# Patient Record
Sex: Male | Born: 1975
Health system: Southern US, Community
[De-identification: ages and names within clinical notes are randomized; demographics above are authoritative.]

## PROBLEM LIST (undated history)

## (undated) ENCOUNTER — Emergency Department (HOSPITAL_COMMUNITY): Discharge status: Absent without leave | Payer: 59

## (undated) DIAGNOSIS — M1990 Unspecified osteoarthritis, unspecified site: Secondary | ICD-10-CM

## (undated) DIAGNOSIS — E785 Hyperlipidemia, unspecified: Secondary | ICD-10-CM

## (undated) DIAGNOSIS — I10 Essential (primary) hypertension: Secondary | ICD-10-CM

## (undated) DIAGNOSIS — J189 Pneumonia, unspecified organism: Secondary | ICD-10-CM

## (undated) DIAGNOSIS — Z9289 Personal history of other medical treatment: Secondary | ICD-10-CM

## (undated) HISTORY — DX: Unspecified osteoarthritis, unspecified site: M19.90

## (undated) HISTORY — DX: Hyperlipidemia, unspecified: E78.5

## (undated) HISTORY — PX: APPENDECTOMY: SHX54

## (undated) HISTORY — DX: Personal history of other medical treatment: Z92.89

---

## 2010-07-09 HISTORY — PX: COLONOSCOPY: SHX174

## 2012-08-11 DIAGNOSIS — J189 Pneumonia, unspecified organism: Secondary | ICD-10-CM

## 2012-08-11 HISTORY — DX: Pneumonia, unspecified organism: J18.9

## 2012-08-12 ENCOUNTER — Emergency Department (HOSPITAL_COMMUNITY): Payer: 59

## 2012-08-12 ENCOUNTER — Encounter (HOSPITAL_COMMUNITY): Payer: Self-pay | Admitting: *Deleted

## 2012-08-12 ENCOUNTER — Inpatient Hospital Stay (HOSPITAL_COMMUNITY)
Admission: EM | Admit: 2012-08-12 | Discharge: 2012-08-13 | DRG: 195 | Disposition: A | Discharge status: Home / Self Care | Payer: 59 | Attending: Internal Medicine | Admitting: Internal Medicine

## 2012-08-12 DIAGNOSIS — J189 Pneumonia, unspecified organism: Principal | ICD-10-CM | POA: Diagnosis present

## 2012-08-12 DIAGNOSIS — I10 Essential (primary) hypertension: Secondary | ICD-10-CM | POA: Diagnosis present

## 2012-08-12 DIAGNOSIS — R000 Tachycardia, unspecified: Secondary | ICD-10-CM | POA: Diagnosis present

## 2012-08-12 HISTORY — DX: Pneumonia, unspecified organism: J18.9

## 2012-08-12 HISTORY — DX: Essential (primary) hypertension: I10

## 2012-08-12 LAB — CBC WITH DIFFERENTIAL/PLATELET
Hemoglobin: 16.1 g/dL (ref 13.0–17.0)
Lymphs Abs: 3.2 10*3/uL (ref 0.7–4.0)
Monocytes Relative: 5 % (ref 3–12)
Neutro Abs: 6.5 10*3/uL (ref 1.7–7.7)
Neutrophils Relative %: 62 % (ref 43–77)
Platelets: 212 10*3/uL (ref 150–400)
RBC: 5.02 MIL/uL (ref 4.22–5.81)
WBC: 10.4 10*3/uL (ref 4.0–10.5)

## 2012-08-12 LAB — BASIC METABOLIC PANEL
BUN: 18 mg/dL (ref 6–23)
Chloride: 103 mEq/L (ref 96–112)
GFR calc Af Amer: 81 mL/min — ABNORMAL LOW (ref >90)
Glucose, Bld: 107 mg/dL — ABNORMAL HIGH (ref 70–99)
Potassium: 3.8 mEq/L (ref 3.5–5.1)
Sodium: 140 mEq/L (ref 135–145)

## 2012-08-12 LAB — EXPECTORATED SPUTUM ASSESSMENT W GRAM STAIN, RFLX TO RESP C: Special Requests: NORMAL

## 2012-08-12 LAB — PRO B NATRIURETIC PEPTIDE: Pro B Natriuretic peptide (BNP): 37.3 pg/mL (ref 0–125)

## 2012-08-12 MED ORDER — DEXTROSE 5 % IV SOLN
500.0000 mg | INTRAVENOUS | Status: DC
  Administered 2012-08-12: 500 mg via INTRAVENOUS
  Filled 2012-08-12 (×2): qty 500

## 2012-08-12 MED ORDER — DEXTROSE 5 % IV SOLN
1.0000 g | INTRAVENOUS | Status: DC
  Administered 2012-08-12: 1 g via INTRAVENOUS
  Filled 2012-08-12 (×2): qty 10

## 2012-08-12 MED ORDER — IBUPROFEN 800 MG PO TABS
800.0000 mg | ORAL_TABLET | Freq: Once | ORAL | Status: AC
  Administered 2012-08-12: 800 mg via ORAL
  Filled 2012-08-12: qty 1

## 2012-08-12 MED ORDER — SODIUM CHLORIDE 0.9 % IV SOLN
INTRAVENOUS | Status: DC
  Administered 2012-08-12: 23:00:00 via INTRAVENOUS
  Administered 2012-08-12: 100 mL/h via INTRAVENOUS

## 2012-08-12 NOTE — ED Notes (Signed)
Paged teaching service per Dr. Radford Pax

## 2012-08-12 NOTE — ED Notes (Signed)
Pt here complaining of respiratory distress, cough, tachycardia, and HTN.

## 2012-08-12 NOTE — ED Notes (Signed)
Transferred to xray on stretcher.

## 2012-08-12 NOTE — H&P (Signed)
[facility] History and Physical  Cody Ternes WUJ:[id number] DOB: 04/11/1976 DOA: 08/12/2012  Referring physician: Dr. Radford Pax PCP: No primary provider on file.   Chief Complaint: SOB, fever/chills  HPI: Cody Graham is a 37 y.o. 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 He had an URI/sinus symptoms last week and went to see his PCP. He was placed on Augmentin at that time and he took that since last Friday (~5 days). He felt progressively worse over the weekend and feeling weak. Today he had fever/rigors/chills and he took his own pulse and was in the 120s. He denies any chest pain, has no NV. Endorses diarrhea since starting taking his Augmentin with loose stools. He has a sore throat which has been there since last week. Has a rapid strep Ag test at the Urgent center which was negative last week. Denies leg swelling.  Review of Systems: as per HPI otherwise negative.   Past Medical History  Diagnosis Date  . Hypertension    Past Surgical History  Procedure Date  . Appendectomy    Social History: No smoking, rare alcohol (social), no drugs.   Allergies  Allergen Reactions  . Lisinopril     cough   Family history Father - colon cancer  Prior to Admission medications   Medication Sig Start Date End Date Taking? Authorizing Provider  Amoxicillin-Pot Clavulanate (AUGMENTIN PO) Take 1 tablet by mouth 2 (two) times daily. For 7 days starting on 08/08/12   Yes Historical Provider, MD  ibuprofen (ADVIL,MOTRIN) 200 MG tablet Take 800 mg by mouth every 8 (eight) hours as needed. For pain   Yes Historical Provider, MD   Physical Exam: Filed Vitals:   08/12/12 0918 08/12/12 1004 08/12/12 1030  BP: 180/134 159/83 136/68  Pulse: 123  109  Temp: 100.4 F (38 C)    TempSrc: Oral    Resp: 28    SpO2: 99%  99%     General:  NAD, flushed  Eyes: right conjunctivae injected, PERRL, EOMI  ENT: mild  oropharynx erythema  Neck: supple, no JVD, no LAD  Cardiovascular: RRR, tachycardic, no MRG  Respiratory: good air movement, no wheezing, no crackles  Abdomen: soft, NTTP  Skin: no rashes  Musculoskeletal: no LE edema  Psychiatric: normal mood and affect  Neurologic: non focal, moves all 4   Labs on Admission:  Basic Metabolic Panel:  Lab 08/12/12 9562  NA 140  K 3.8  CL 103  CO2 26  GLUCOSE 107*  BUN 18  CREATININE 1.28  CALCIUM 9.2  MG --  PHOS --   CBC:  Lab 08/12/12 0920  WBC 10.4  NEUTROABS 6.5  HGB 16.1  HCT 43.6  MCV 86.9  PLT 212   Radiological Exams on Admission: Dg Chest 2 View  08/12/2012  *RADIOLOGY REPORT*  Clinical Data: Fever, tachycardia  CHEST - 2 VIEW  Comparison: None  Findings: Normal mediastinum and cardiac silhouette.  There is small focus of air space disease in the right upper lobe concerning for early pneumonia.  No pleural fluid.  No pneumothorax.  IMPRESSION: Early right upper lobe pneumonia.   Original Report Authenticated By: Genevive Bi, M.D.     EKG: Independently reviewed.   Assessment/Plan Active Problems:  CAP (community acquired pneumonia)  Hypertension   1. CAP - failed outpatient therapy with Augmentin. Will admit for observation, Ceftriaxone/Azithromycin. Pending clinical improvement can be transitioned to po  Levaquin tomorrow. IV fluids as he looks dehydrated and tachycardic. Already received fluid bolus in the ED. 2. HTN - had HTN in the past, did not tolerate Lisinopril. Currently on no medication. Resolved after initially high blood pressure in the ED. Will continue to monitor. 3. Ppx - early ambulation, SCDs.  Code Status: Full Family Communication: none Disposition Plan: 1-2 days  Time spent: 50 min for chart review, patient exam and plan formulation.   Pamella Pert [facility] Pager [phone]  If 7PM-7AM, please contact night-coverage [email or web address] Password [email or web address] 08/12/2012, 11:45  AM

## 2012-08-12 NOTE — ED Notes (Signed)
Admitting MD at bedside. Pt alert and oriented. Talking on phone with family. Receiving second bag of fluid, HR remains 110-120. No needs at this time.

## 2012-08-12 NOTE — ED Notes (Signed)
Phlebotomy at bedside drawing labs. Will admin antibiotics thereafter. Waiting for ready bed placement.

## 2012-08-12 NOTE — ED Notes (Signed)
C/o sob cough just generalized bodyaches with fever chills.

## 2012-08-13 DIAGNOSIS — I10 Essential (primary) hypertension: Secondary | ICD-10-CM

## 2012-08-13 DIAGNOSIS — J189 Pneumonia, unspecified organism: Secondary | ICD-10-CM | POA: Diagnosis present

## 2012-08-13 LAB — LEGIONELLA ANTIGEN, URINE: Legionella Antigen, Urine: NEGATIVE

## 2012-08-13 MED ORDER — LEVOFLOXACIN 750 MG PO TABS: 750.0000 mg | ORAL_TABLET | Freq: Every day | ORAL | Status: DC

## 2012-08-13 MED ORDER — ACETAMINOPHEN 325 MG PO TABS
650.0000 mg | ORAL_TABLET | ORAL | Status: DC | PRN
  Administered 2012-08-13: 650 mg via ORAL
  Filled 2012-08-13: qty 2

## 2012-08-13 MED ORDER — LEVOFLOXACIN 750 MG PO TABS
750.0000 mg | ORAL_TABLET | Freq: Every day | ORAL | Status: DC
  Administered 2012-08-13: 750 mg via ORAL
  Filled 2012-08-13: qty 1

## 2012-08-13 MED ORDER — HYDROCOD POLST-CHLORPHEN POLST 10-8 MG/5ML PO LQCR: 5.0000 mL | Freq: Two times a day (BID) | ORAL | Status: DC | PRN

## 2012-08-13 NOTE — Progress Notes (Signed)
Cody Graham discharged Home per MD order.  Discharge instructions reviewed and discussed with the patient, all questions and concerns answered. Copy of instructions and scripts given to patient.  Explained how to sign up for Mychart.   [name], Cody  Home Medication Instructions [id number]   Printed on:08/13/12 1219  Medication Information                    ibuprofen (ADVIL,MOTRIN) 200 MG tablet Take 800 mg by mouth every 8 (eight) hours as needed. For pain           levofloxacin (LEVAQUIN) 750 MG tablet Take 1 tablet (750 mg total) by mouth daily.           chlorpheniramine-HYDROcodone (TUSSIONEX PENNKINETIC ER) 10-8 MG/5ML LQCR Take 5 mLs by mouth every 12 (twelve) hours as needed.             Patients skin is clean, dry and intact, no evidence of skin break down. IV site discontinued and catheter remains intact. Site without signs and symptoms of complications. Dressing and pressure applied.  Patient escorted to car by NS/MT ambulating,  no distress noted upon discharge.  Cody Graham, Cody Graham 08/13/2012 12:19 PM

## 2012-08-13 NOTE — Discharge Summary (Signed)
-   Agree with as above continue oral antibiotics. Followup with her primary care Dr. next week.

## 2012-08-13 NOTE — Care Management Note (Signed)
    Page 1 of 1   08/13/2012     10:34:46 AM   CARE MANAGEMENT NOTE 08/13/2012  Patient:  BRANDIS, MATSUURA   Account Number:  [id number]  Date Initiated:  08/13/2012  Documentation initiated by:  Letha Cape  Subjective/Objective Assessment:   dx pna  admit- pta independent.     Action/Plan:   Anticipated DC Date:  08/13/2012   Anticipated DC Plan:  HOME/SELF CARE      DC Planning Services  CM consult      Choice offered to / List presented to:             Status of service:  Completed, signed off Medicare Important Message given?   (If response is "NO", the following Medicare IM given date fields will be blank) Date Medicare IM given:   Date Additional Medicare IM given:    Discharge Disposition:  HOME/SELF CARE  Per UR Regulation:  Reviewed for med. necessity/level of care/duration of stay  If discussed at Long Length of Stay Meetings, dates discussed:    Comments:  08/13/12 10:33 Letha Cape RN, BSN [phone] patient lives with spouse, pta independent, patient has medication coverage and transportation at discharge, no needs anticipated.

## 2012-08-13 NOTE — Discharge Summary (Signed)
Physician Discharge Summary  Cody Nunn ZOX:[id number] DOB: [birth year] DOA: 08/12/2012  PCP: No primary provider on file.  Admit date: 08/12/2012 Discharge date: 08/13/2012  Time spent: 30 minutes  Recommendations for Outpatient Follow-up:  Monitor to ensure resolution of symptoms. Patient had some tachycardia, high blood pressure, and diarrhea while he was inpatient.  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 He had an URI/sinus symptoms as well as pinkeye last week and went to see his PCP. He was placed on Augmentin at that time and he took that ~5 days. He felt progressively worse over the weekend and feeling weak. Today he had fever/rigors/chills and he took his own pulse and was in the 120s. He denies any chest pain, has no NV. Endorses diarrhea since starting taking his Augmentin with loose stools. He has a sore throat which has been there since last week. Has a rapid strep Ag test at the Urgent center which was negative last week. Denies leg swelling.   Hospital Course:  Chest x-ray revealed right upper lobe opacity which was felt to be early pneumonia. Because of the exposure this particular patient has working in the emergency department, this would be appropriately categorized as healthcare associated pneumonia and should be treated as such. Mr. Kats was given IV Rocephin and a azithromycin for coverage of healthcare associated pneumonia. Blood cultures were obtained but have not yet  resulted. Urine Legionella antigen and urine strep pneumo were both negative. Initially the patient had an elevated pulse rate (123) and elevated blood pressure (180/134), however, overnight with antibiotics and IV fluids his pulse and blood pressure returned to normal levels. When I examined Mr. Dauber he appeared nontoxic and in no distress. We discussed his diarrhea. He had had 2 episodes on February 4, and 2 episodes this morning. He reported that his stools were starting to firm up and did not smell as though they were infectious diarrhea. We concluded that the diarrhea was most likely secondary to Augmentin. He still had a griping non-productive cough for which we prescribed Tussionex (particularly for use at bedtime).  After he ambulated in the hallway without shortness of breath or weakness, he was discharged to home with 6 more days of oral antibiotic therapy (Levaquin).   Discharge Exam: Filed Vitals:   08/12/12 1159 08/12/12 1351 08/12/12 2107 08/13/12 0614  BP: 126/79 137/84 131/75 127/79  Pulse: 109 97 100 68  Temp: 99.1 F (37.3 C) 98.1 F (36.7 C) 98.2 F (36.8 C) 97.9 F (36.6 C)  TempSrc: Oral Oral Oral Oral  Resp: 22 18 18 20   Height:  6' 4.8" (1.951 m)    Weight:  111.585 kg (246 lb)    SpO2: 98% 96% 100% 98%    General: Well-developed well-nourished Caucasian male lying comfortably in bed HEENT: Pupils equal and round, no obvious exudates or erythema in oropharynx, moist mucous membranes Cardiovascular: Regular rate and rhythm no murmurs rubs or gallops Respiratory: Clear to auscultation bilaterally, no accessory muscle use, nonproductive paroxysmal  cough Abdomen: Nontender, nondistended, positive bowel sounds, no obvious masses Skin: No obvious rashes, bruises, lesions Extremities: No edema, able to move all 4, symmetric strength   Discharge Instructions      Discharge Orders    Future Orders Please Complete By Expires   Diet - low sodium heart healthy       Increase activity slowly          Medication List     As of 08/13/2012  4:00 PM    STOP taking these medications         AUGMENTIN PO      TAKE these medications         chlorpheniramine-HYDROcodone 10-8 MG/5ML Lqcr   Commonly known as: TUSSIONEX   Take 5 mLs by mouth every 12 (twelve) hours as needed.      ibuprofen 200 MG tablet   Commonly known as: ADVIL,MOTRIN   Take 800 mg by mouth every 8 (eight) hours as needed. For pain      levofloxacin 750 MG tablet   Commonly known as: LEVAQUIN   Take 1 tablet (750 mg total) by mouth daily.            The results of significant diagnostics from this hospitalization (including imaging, microbiology, ancillary and laboratory) are listed below for reference.    Significant Diagnostic Studies: Dg Chest 2 View  08/12/2012  *RADIOLOGY REPORT*  Clinical Data: Fever, tachycardia  CHEST - 2 VIEW  Comparison: None  Findings: Normal mediastinum and cardiac silhouette.  There is small focus of air space disease in the right upper lobe concerning for early pneumonia.  No pleural fluid.  No pneumothorax.  IMPRESSION: Early right upper lobe pneumonia.   Original Report Authenticated By: Genevive Bi, M.D.     Microbiology: Recent Results (from the past 240 hour(s))  CULTURE, BLOOD (ROUTINE X 2)     Status: Normal (Preliminary result)   Collection Time   08/12/12 12:05 PM      Component Value Range Status Comment   Specimen Description BLOOD LEFT ARM   Final    Special Requests     Final    Value: BOTTLES DRAWN AEROBIC AND ANAEROBIC AERO 10CC ANA 5CC   Culture  Setup Time 08/12/2012 15:50   Final    Culture     Final    Value:        BLOOD CULTURE RECEIVED NO GROWTH TO DATE CULTURE WILL BE HELD FOR 5 DAYS BEFORE ISSUING A FINAL NEGATIVE REPORT   Report Status PENDING   Incomplete   CULTURE, BLOOD (ROUTINE X 2)     Status: Normal (Preliminary result)   Collection Time   08/12/12 12:15 PM      Component Value Range Status Comment    Specimen Description BLOOD HAND LEFT   Final    Special Requests     Final    Value: BOTTLES DRAWN AEROBIC AND ANAEROBIC BLUE 10CC RED 5CC NO 2   Culture  Setup Time 08/12/2012 15:50   Final    Culture     Final    Value:        BLOOD CULTURE RECEIVED NO GROWTH TO DATE CULTURE WILL BE HELD FOR 5 DAYS BEFORE ISSUING A FINAL NEGATIVE REPORT   Report Status PENDING   Incomplete   CULTURE, EXPECTORATED SPUTUM-ASSESSMENT     Status: Normal   Collection Time   08/12/12  9:00 PM      Component Value Range Status Comment  Specimen Description SPUTUM   Final    Special Requests Normal   Final    Sputum evaluation     Final    Value: THIS SPECIMEN IS ACCEPTABLE. RESPIRATORY CULTURE REPORT TO FOLLOW.   Report Status 08/12/2012 FINAL   Final      Labs: Basic Metabolic Panel:  Lab 08/12/12 0347  NA 140  K 3.8  CL 103  CO2 26  GLUCOSE 107*  BUN 18  CREATININE 1.28  CALCIUM 9.2  MG --  PHOS --   CBC:  Lab 08/12/12 0920  WBC 10.4  NEUTROABS 6.5  HGB 16.1  HCT 43.6  MCV 86.9  PLT 212   BNP: BNP (last 3 results)  Basename 08/12/12 1211  PROBNP 37.3      Signed:  Algis Downs, PA-C  [facility]  [phone]  08/13/2012, 4:00 pm.

## 2012-08-14 NOTE — ED Provider Notes (Signed)
History     CSN: [id number]  Arrival date & time 08/12/12  0915   First MD Initiated Contact with Patient 08/12/12 [phone]      Chief Complaint  Patient presents with  . Shortness of Breath     HPI C/o sob cough just generalized bodyaches with fever chills.   Has been on Augmentin for 4-5 days.  Some SOB with exertion.  Past Medical History  Diagnosis Date  . Hypertension   . Pneumonia 08/11/2012    Past Surgical History  Procedure Date  . Appendectomy   . Colonoscopy 2012    History reviewed. No pertinent family history.  History  Substance Use Topics  . Smoking status: Never Smoker   . Smokeless tobacco: Never Used  . Alcohol Use: Yes      Review of Systems  All other systems reviewed and are negative.    Allergies  Lisinopril  Home Medications   Current Outpatient Rx  Name  Route  Sig  Dispense  Refill  . IBUPROFEN 200 MG PO TABS   Oral   Take 800 mg by mouth every 8 (eight) hours as needed. For pain         . HYDROCOD POLST-CPM POLST ER 10-8 MG/5ML PO LQCR   Oral   Take 5 mLs by mouth every 12 (twelve) hours as needed.   140 mL   0   . LEVOFLOXACIN 750 MG PO TABS   Oral   Take 1 tablet (750 mg total) by mouth daily.   6 tablet   0     BP 127/79  Pulse 68  Temp 97.9 F (36.6 C) (Oral)  Resp 20  Ht 6' 4.8" (1.951 m)  Wt 246 lb (111.585 kg)  BMI 29.32 kg/m2  SpO2 98%  Physical Exam  Nursing note and vitals reviewed. Constitutional: He is oriented to person, place, and time. He appears well-developed and well-nourished. No distress.  HENT:  Head: Normocephalic and atraumatic.  Eyes: Pupils are equal, round, and reactive to light.  Neck: Normal range of motion.  Cardiovascular: Normal rate and intact distal pulses.   Pulmonary/Chest: No respiratory distress. He has rales (R lung).  Abdominal: Normal appearance. He exhibits no distension.  Musculoskeletal: Normal range of motion.  Neurological: He is alert and oriented to person,  place, and time. No cranial nerve deficit.  Skin: Skin is warm and dry. No rash noted.  Psychiatric: He has a normal mood and affect. His behavior is normal.    ED Course  Procedures (including critical care time)  Labs Reviewed  CBC WITH DIFFERENTIAL - Abnormal; Notable for the following:    MCHC 36.9 (*)     All other components within normal limits  BASIC METABOLIC PANEL - Abnormal; Notable for the following:    Glucose, Bld 107 (*)     GFR calc non Af Amer 70 (*)     GFR calc Af Amer 81 (*)     All other components within normal limits  PRO B NATRIURETIC PEPTIDE  HIV ANTIBODY (ROUTINE TESTING)  CULTURE, BLOOD (ROUTINE X 2)  CULTURE, BLOOD (ROUTINE X 2)  CULTURE, EXPECTORATED SPUTUM-ASSESSMENT  LEGIONELLA ANTIGEN, URINE  STREP PNEUMONIAE URINARY ANTIGEN  CULTURE, RESPIRATORY (NON-EXPECTORATED)  GRAM STAIN   No results found.   1. Community acquired pneumonia   2. Hypertension   3. CAP (community acquired pneumonia)       MDM        Nelia Shi, MD 08/14/12 1336

## 2012-08-15 LAB — CULTURE, RESPIRATORY W GRAM STAIN

## 2012-08-18 LAB — CULTURE, BLOOD (ROUTINE X 2): Culture: NO GROWTH

## 2013-07-09 DIAGNOSIS — I10 Essential (primary) hypertension: Secondary | ICD-10-CM

## 2013-07-09 HISTORY — DX: Essential (primary) hypertension: I10

## 2014-09-02 ENCOUNTER — Other Ambulatory Visit (HOSPITAL_COMMUNITY): Payer: Self-pay | Admitting: Orthopaedic Surgery

## 2014-09-02 DIAGNOSIS — M25511 Pain in right shoulder: Secondary | ICD-10-CM

## 2014-09-29 ENCOUNTER — Ambulatory Visit (HOSPITAL_COMMUNITY)
Admission: RE | Admit: 2014-09-29 | Discharge: 2014-09-29 | Disposition: A | Discharge status: Home / Self Care | Payer: 59 | Source: Ambulatory Visit | Attending: Orthopaedic Surgery | Admitting: Orthopaedic Surgery

## 2014-09-29 DIAGNOSIS — M19011 Primary osteoarthritis, right shoulder: Secondary | ICD-10-CM | POA: Diagnosis not present

## 2014-09-29 DIAGNOSIS — M25511 Pain in right shoulder: Secondary | ICD-10-CM | POA: Insufficient documentation

## 2014-09-29 DIAGNOSIS — R937 Abnormal findings on diagnostic imaging of other parts of musculoskeletal system: Secondary | ICD-10-CM | POA: Insufficient documentation

## 2014-09-29 DIAGNOSIS — R531 Weakness: Secondary | ICD-10-CM | POA: Diagnosis not present

## 2014-11-29 ENCOUNTER — Encounter: Payer: Self-pay | Admitting: Rehabilitative and Restorative Service Providers"

## 2014-11-29 ENCOUNTER — Ambulatory Visit (INDEPENDENT_AMBULATORY_CARE_PROVIDER_SITE_OTHER): Payer: 59 | Admitting: Rehabilitative and Restorative Service Providers"

## 2014-11-29 DIAGNOSIS — G8929 Other chronic pain: Secondary | ICD-10-CM | POA: Diagnosis not present

## 2014-11-29 DIAGNOSIS — M25611 Stiffness of right shoulder, not elsewhere classified: Secondary | ICD-10-CM

## 2014-11-29 DIAGNOSIS — M25511 Pain in right shoulder: Secondary | ICD-10-CM

## 2014-11-29 NOTE — Patient Instructions (Addendum)
Worked on posture and alignment with use of foam roll Education re posture and shoulder pathology  Scapular Retraction (Standing)   With arms at sides, pinch shoulder blades together. Repeat 10____ times per set. Do _several___ sets per day.   http://orth.exer.us/944   Copyright  VHI. All rights reserved.  Biceps, Standing holding stationary object   Place arms behind back with hands together, palms facing down. Have partner raise hands. Hold 20 seconds. Repeat __3_ times per session. Do 2-3___ sessions per day.  Copyright  VHI. All rights reserved.  CHEST: Doorway, Bilateral - Standing   Standing in doorway, place hands on wall with hands even with shoulders; elbows bent at shoulder height, hands up high. Shift hips forward through the doorway as tolerated - No weight on arms - weight should be through legs Hold _20_ seconds. Work toward 30 sec hold_2-3__ reps each position, 2-4 times/day

## 2014-11-29 NOTE — Therapy (Signed)
[place], [place], [address] Phone: [phone]   Fax:  [phone]  Physical Therapy Evaluation  Patient Details  Name: Cody Graham MRN: [id number] Date of Birth: 1976/01/11 Referring Provider:  Leandrew Koyanagi, MD  Encounter Date: 11/29/2014      PT End of Session - 11/29/14 1021    Visit Number 1   Number of Visits 8   Date for PT Re-Evaluation 01/17/15   PT Start Time 1016   PT Stop Time 1115   PT Time Calculation (min) 59 min   Activity Tolerance Patient tolerated treatment well;No increased pain      Past Medical History  Diagnosis Date  . Hypertension   . Pneumonia 08/11/2012    Past Surgical History  Procedure Laterality Date  . Appendectomy    . Colonoscopy  2012    There were no vitals filed for this visit.  Visit Diagnosis:  Right anterior shoulder pain  Stiffness of joint, shoulder region, right  Chronic shoulder pain, right      Subjective Assessment - 11/29/14 1027    Subjective Shoulder pain for past 7 months - no known cause of injury. Received injection with [place] March/April with some improvement - unable to maintain sustanined postures with Rt UE; throw item; reaching behind body   Pertinent History PMH: injury to R shoulder at 39 years old arm pulled behind him rehabed for about 1 year and then returned to normal functional activities   Limitations Other (comment)  throwing, reaching, bench press, quick movements   Diagnostic tests X-rays and MRI - negative   Patient Stated Goals To able to use arm normally without pain   Currently in Pain? Yes   Pain Score 8    Pain Location Shoulder   Pain Orientation Right   Pain Descriptors / Indicators Sharp;Stabbing;Shooting   Pain Type Chronic pain   Pain Onset More than a month ago   Pain Frequency Intermittent   Aggravating Factors  reaching behind back, wipping surfaces, throwing, lying on R side arm extended    Pain Relieving Factors OTC antiinflammatory, rest   Effect of Pain on Daily Activities limits activities that produce pain   Multiple Pain Sites No            [facility] PT Assessment - 11/29/14 0001    Assessment   Medical Diagnosis --  Shoulder pain; biceps tendinitis   Onset Date/Surgical Date --  04/22/14   Next MD Visit --  none scheduled   Prior Function   Level of Independence Independent with basic ADLs   Vocation Full time employment   Vocation Requirements --  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   Leisure --  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    Observation/Other Assessments   Observations empty can + Rt; - hawkins, - impingement   ROM / Strength   AROM / PROM / Strength AROM;PROM;Strength   AROM   AROM Assessment Site Shoulder;Cervical   Right/Left Shoulder Right;Left   Right Shoulder Extension 35 Degrees   Right Shoulder Flexion 142 Degrees   Right Shoulder ABduction 148 Degrees   Right Shoulder Internal Rotation 0 Degrees  functional T12   Right Shoulder External Rotation 90 Degrees  58 in neutral   Left Shoulder Extension 50 Degrees   Left Shoulder Flexion 164 Degrees   Left Shoulder ABduction 155 Degrees   Left Shoulder Internal Rotation 30  Degrees  function T7 with thumb   Left Shoulder External Rotation 90 Degrees  in neutral 57   Cervical - Right Side Bend 25   Cervical - Left Side Bend 32   Cervical - Right Rotation 69   Cervical - Left Rotation 55   Strength   Overall Strength Comments Bil elbow 5/5   Strength Assessment Site Shoulder   Right/Left Shoulder Right;Left   Right Shoulder Flexion 5/5   Right Shoulder Extension 5/5   Right Shoulder ABduction 5/5   Right Shoulder Internal Rotation 5/5   Right Shoulder External Rotation 5/5  pain   Left Shoulder Flexion 5/5   Left Shoulder Extension 5/5   Left Shoulder ABduction 5/5   Left Shoulder Internal Rotation 5/5   Left Shoulder External Rotation 5/5    Flexibility   Soft Tissue Assessment /Muscle Length --  shortened pecs bilat   Palpation   Palpation comment --  tightness noted pecs, trap, teres, biceps                   [facility] Adult PT Treatment/Exercise - 11/29/14 0001    Posture/Postural Control   Posture/Postural Control Postural limitations   Posture Comments Head forward shoulders rounded and elevated   Exercises   Exercises Shoulder   Shoulder Exercises: Standing   Other Standing Exercises scapular retraction down and back 10 reps 5-10 sec hold   Other Standing Exercises snow angel passive supine 5-10 minutes   Shoulder Exercises: Stretch   Corner Stretch 3 reps;20 seconds   Corner Stretch Limitations --  3 position doorwar stretch    Other Shoulder Stretches --  biceps stretch 3 reps 20-30 sec hold 3-4 times/day   Cryotherapy   Number Minutes Cryotherapy 12 Minutes   Cryotherapy Location Shoulder   Type of Cryotherapy Ice pack                PT Education - 11/29/14 1328    Education provided Yes   Education Details Postural education and correction; exercise instruction; HEP   Person(s) Educated Patient   Methods Explanation;Demonstration;Tactile cues;Verbal cues;Handout   Comprehension Verbalized understanding;Returned demonstration;Verbal cues required;Tactile cues required          PT Short Term Goals - 11/29/14 1350    PT SHORT TERM GOAL #3   Title Decrease pain in Rt shoulder to 3-4/10    Time 4   Period Weeks   Status New           PT Long Term Goals - 11/29/14 1342    PT LONG TERM GOAL #1   Title Increase Rt shoulder AROM by 5-10 degrees in all limited planes   Time 6   Period Weeks   Status New   PT LONG TERM GOAL #2   Title Improve functional activity level allowing patient to reach behind back without pain    Time 8   Period Weeks   Status New   PT LONG TERM GOAL #3   Title Decrease FOTO to 25 or less   Time 8   Period Weeks   Status New                Plan - 11/29/14 1330    Clinical Impression Statement Patient presents with chronic, recurrent Rt shoulder pain following a reaching injury 10/16 when he reached behind his body. He has had continued intermittent pain since injury. Injection helped but symptoms continue. Pt has pain with functional activities, limited ROM and flexibility, muscular  imbalance, abnormal posture and alignment - (UE's in IR at sides, scapulae abducted and rotated along the thoracic wall, head of the humerus anterior in orientation, forward head posture, increased thoracic kyphosis)   Pt will benefit from skilled therapeutic intervention in order to improve on the following deficits Decreased activity tolerance;Decreased range of motion;Hypomobility;Decreased mobility;Impaired UE functional use   Rehab Potential Excellent   PT Frequency 1x / week   PT Duration 8 weeks   PT Treatment/Interventions ADLs/Self Care Home Management;Cryotherapy;Electrical Stimulation;Iontophoresis 4mg /ml Dexamethasone;Moist Heat;Ultrasound;Therapeutic activities;Therapeutic exercise;Neuromuscular re-education;Patient/family education;Manual techniques;Passive range of motion   PT Next Visit Plan Postural work; scapular stabilization; shoulder stretching/ROM - progressing to posterior shoulder girdle strengthening    PT Home Exercise Plan HEP and postural work   [employer] and Agree with Plan of Care Patient         Problem List Patient Active Problem List   Diagnosis Date Noted  . Healthcare-associated pneumonia 08/13/2012  . Hypertension 08/12/2012    Everardo All, PT, MPH 11/29/2014, 2:30 PM  [facility] [place], [place], [address] Phone: [phone]   Fax:  [phone]

## 2014-12-08 ENCOUNTER — Ambulatory Visit (INDEPENDENT_AMBULATORY_CARE_PROVIDER_SITE_OTHER): Payer: 59 | Admitting: Rehabilitative and Restorative Service Providers"

## 2014-12-08 DIAGNOSIS — M25511 Pain in right shoulder: Secondary | ICD-10-CM

## 2014-12-08 DIAGNOSIS — G8929 Other chronic pain: Secondary | ICD-10-CM

## 2014-12-08 DIAGNOSIS — M25611 Stiffness of right shoulder, not elsewhere classified: Secondary | ICD-10-CM

## 2014-12-08 NOTE — Patient Instructions (Addendum)
(  Clinic) Extension / Flexion (Assist)   Face pulley, right arm as far forward and up as is pain free. Pull arm down toward side. Repeat __10__ times per set. Do _2-3___ sets per session. Keep shoulder blades down and back throughout   Copyright  VHI. All rights reserved.  High Row: Standing   Face anchor, feet shoulder width apart. Thumbs up arms by side, pull arms back, squeezing shoulder blades together. Repeat 10__ times per set. Do _2-3_ sets per session. Do _1_ sessions per day Anchor Height: Chest Keep shoulder blades down and back throughout.  http://tub.exer.us/63   Copyright  VHI. All rights reserved.   Scapula Adduction With Pectorals, Low   Stand in doorframe with palms against frame and arms at 45. Lean forward and squeeze shoulder blades. Hold _30__ seconds. Repeat __3_ times per session. Do __3-4_ sessions per day.  Copyright  VHI. All rights reserved.    Scapula Adduction With Pectorals, Mid-Range   Stand in doorframe with palms against frame and arms at 90. Lean forward and squeeze shoulder blades. Hold _30__ seconds. Repeat 3___ times per session. Do _3-4__ sessions per day. \Scapula Adduction With Pectorals, High   Stand in doorframe with palms against frame and arms at 120. Lean forward and squeeze shoulder blades. Hold 30___ seconds. Repeat _3__ times per session. Do 3-4___ sessions per day.  Copyright  VHI. All rights reserved.

## 2014-12-08 NOTE — Therapy (Addendum)
[place], [place], [address] Phone: [phone]   Fax:  [phone]  Physical Therapy Treatment  Patient Details  Name: Cody Graham MRN: [id number] Date of Birth: 1975/10/02 Referring Provider:  Leandrew Koyanagi, MD  Encounter Date: 12/08/2014      PT End of Session - 12/08/14 0939    Visit Number 2   Number of Visits 8   Date for PT Re-Evaluation 01/17/15   PT Start Time 0940   PT Stop Time 3818   PT Time Calculation (min) 65 min   Activity Tolerance Patient tolerated treatment well;No increased pain      Past Medical History  Diagnosis Date  . Hypertension   . Pneumonia 08/11/2012    Past Surgical History  Procedure Laterality Date  . Appendectomy    . Colonoscopy  2012    There were no vitals filed for this visit.  Visit Diagnosis:  Right anterior shoulder pain  Stiffness of joint, shoulder region, right  Chronic shoulder pain, right      Subjective Assessment - 12/08/14 0942    Subjective Shoulder pain is about the same - worked in the yard cleaning up trees/limbs. He is working on exercises - the one he feels the most is the biceps stretch. Not taking the antiinflammatory   Patient Stated Goals To able to use arm normally without pain   Currently in Pain? No/denies   Aggravating Factors  reaching quickly for object.           [facility] Adult PT Treatment/Exercise - 12/08/14 0001    Exercises   Exercises Shoulder   Shoulder Exercises: Standing   Row Strengthening;10 reps;Theraband   Theraband Level (Shoulder Row) Level 2 (Red)   Row Weight (lbs) standing row red TB 10 res 2 sets   Other Standing Exercises scapular retraction down and back 10 reps 5-10 sec hold   Other Standing Exercises snow angel passive supine 5-10 minutes   Shoulder Exercises: ROM/Strengthening   UBE (Upper Arm Bike) Level 2 56min forward/3 min back - standing   Shoulder Exercises: Isometric  Strengthening   External Rotation Theraband   Theraband Level (External Rotation) Level 2 (Red)  10 reps 2 sets   Shoulder Exercises: Stretch   Corner Stretch 3 reps;30 seconds  Doorway   Other Shoulder Stretches --  biceps stretch 3 reps 20-30 sec hold 3-4 times/day   Vasopneumatic   Number Minutes Vasopneumatic  15 minutes   Vasopnuematic Location  Shoulder   Vasopneumatic Pressure Medium   Vasopneumatic Temperature  3*            PT Education - 12/08/14 1036    Education provided Yes   Education Details reviewed and corrected HEP - added Theraband strengtheing, scapular adductioin,  rowing and shoulder extension; worked on posture and alignment; instructed in 3 part core lumbar stabilizatioin - pt having LBP with some of shoulder exercises   Person(s) Educated Patient   Methods Explanation;Demonstration;Tactile cues;Verbal cues   Comprehension Verbalized understanding;Returned demonstration;Verbal cues required;Tactile cues required          PT Short Term Goals - 12/08/14 1041    PT SHORT TERM GOAL #1   Title Independent in HEP with correct technique   Time 3   Period Weeks   Status On-going   PT SHORT TERM GOAL #2   Title Improve posture and alignment - assessed in standing   Time 3   Period Weeks   Status  On-going   PT SHORT TERM GOAL #3   Title Decrease pain in Rt shoulder to 3-4/10    Time 4   Period Weeks   Status On-going           PT Long Term Goals - 12/08/14 1042    PT LONG TERM GOAL #1   Title Increase Rt shoulder AROM by 5-10 degrees in all limited planes   Time 6   Period Weeks   Status On-going   PT LONG TERM GOAL #2   Title Improve functional activity level allowing patient to reach behind back without pain    Time 8   Period Weeks   Status On-going   PT LONG TERM GOAL #3   Title Decrease FOTO to 25 or less   Time 8   Period Weeks   Status On-going            Plan - 12/08/14 1040    Clinical Impression Statement Continued  shoulder pain- poor scapular stability. Needs posterior shoulder girdle strengthening. Cody needs to avoid activities that irritate the shoulder condition.   Rehab Potential Excellent   PT Frequency 1x / week   PT Duration 8 weeks   PT Treatment/Interventions ADLs/Self Care Home Management;Cryotherapy;Electrical Stimulation;Iontophoresis 4mg /ml Dexamethasone;Moist Heat;Ultrasound;Therapeutic activities;Therapeutic exercise;Neuromuscular re-education;Patient/family education;Manual techniques;Passive range of motion   PT Home Exercise Plan HEP and postural work        Problem List Patient Active Problem List   Diagnosis Date Noted  . Healthcare-associated pneumonia 08/13/2012  . Hypertension 08/12/2012    Everardo All, PT, MPH 12/08/2014, 11:00 AM  [facility] [place], [place], [address] Phone: [phone]   Fax:  [phone]

## 2014-12-10 ENCOUNTER — Ambulatory Visit (INDEPENDENT_AMBULATORY_CARE_PROVIDER_SITE_OTHER): Payer: 59 | Admitting: Physical Therapy

## 2014-12-10 DIAGNOSIS — M25511 Pain in right shoulder: Secondary | ICD-10-CM

## 2014-12-10 DIAGNOSIS — M25611 Stiffness of right shoulder, not elsewhere classified: Secondary | ICD-10-CM | POA: Diagnosis not present

## 2014-12-10 DIAGNOSIS — G8929 Other chronic pain: Secondary | ICD-10-CM

## 2014-12-10 NOTE — Therapy (Signed)
[place], [place], [address] Phone: [phone]   Fax:  [phone]  Physical Therapy Treatment  Patient Details  Name: Cody Graham MRN: [id number] Date of Birth: 1975-09-23 Referring Provider:  Leandrew Koyanagi, MD  Encounter Date: 12/10/2014      PT End of Session - 12/10/14 0939    Visit Number 3   Number of Visits 8   Date for PT Re-Evaluation 01/17/15   PT Start Time 5916   PT Stop Time 1028   PT Time Calculation (min) 50 min      Past Medical History  Diagnosis Date  . Hypertension   . Pneumonia 08/11/2012    Past Surgical History  Procedure Laterality Date  . Appendectomy    . Colonoscopy  2012    There were no vitals filed for this visit.  Visit Diagnosis:  Right anterior shoulder pain  Stiffness of joint, shoulder region, right  Chronic shoulder pain, right                       [facility] Adult PT Treatment/Exercise - 12/10/14 0001    Shoulder Exercises: Standing   External Rotation Strengthening;Both;Theraband;10 reps  3 sets 10   Theraband Level (Shoulder External Rotation) Level 3 (Green)   Flexion Strengthening;Right;10 reps  1 set with each wt.    Shoulder Flexion Weight (lbs) 2, 3   ABduction Right;10 reps   Shoulder ABduction Weight (lbs) 2   Row Strengthening;Both;10 reps  elbows at side, elbows up.    Theraband Level (Shoulder Row) Level 3 (Green)   Other Standing Exercises Snow angels standing against wall x 10    Shoulder Exercises: ROM/Strengthening   Wall Pushups 10 reps  push up plus-elbows in.    Shoulder Exercises: Stretch   Corner Stretch 3 reps;30 seconds  Doorway   Other Shoulder Stretches IR shoulder stretch x 30 sec x 2   Shoulder Exercises: Body Blade   Flexion 15 seconds;30 seconds   ABduction 15 seconds;2 reps   ABduction Limitations (scaption plane)   Cryotherapy   Number Minutes Cryotherapy 12 Minutes   Cryotherapy  Location Shoulder  Rt   Type of Cryotherapy Ice pack                PT Education - 12/10/14 1036    Education provided Yes   Education Details HEP- added shoulder ER and wall push up.     Person(s) Educated Patient   Methods Handout;Explanation   Comprehension Verbalized understanding;Returned demonstration          PT Short Term Goals - 12/08/14 1041    PT SHORT TERM GOAL #1   Title Independent in HEP with correct technique   Time 3   Period Weeks   Status On-going   PT SHORT TERM GOAL #2   Title Improve posture and alignment - assessed in standing   Time 3   Period Weeks   Status On-going   PT SHORT TERM GOAL #3   Title Decrease pain in Rt shoulder to 3-4/10    Time 4   Period Weeks   Status On-going           PT Long Term Goals - 12/08/14 1042    PT LONG TERM GOAL #1   Title Increase Rt shoulder AROM by 5-10 degrees in all limited planes   Time 6   Period Weeks   Status On-going   PT LONG  TERM GOAL #2   Title Improve functional activity level allowing patient to reach behind back without pain    Time 8   Period Weeks   Status On-going   PT LONG TERM GOAL #3   Title Decrease FOTO to 25 or less   Time 8   Period Weeks   Status On-going               Plan - 12/10/14 1037    Clinical Impression Statement Pt tolerated all new exercises with minimal increase in Rt shoulder pain.  Pt frequently reported his shoulder felt "weak" with exercises such as flexion, abduction, and rowing.     Pt will benefit from skilled therapeutic intervention in order to improve on the following deficits Decreased activity tolerance;Decreased range of motion;Hypomobility;Decreased mobility;Impaired UE functional use   Rehab Potential Excellent   PT Frequency 2x / week  spoke with supervising PT, Celyn Holt, freq to be at 2x/wk.    PT Duration 8 weeks   PT Treatment/Interventions ADLs/Self Care Home Management;Cryotherapy;Electrical Stimulation;Iontophoresis  4mg /ml Dexamethasone;Moist Heat;Ultrasound;Therapeutic activities;Therapeutic exercise;Neuromuscular re-education;Patient/family education;Manual techniques;Passive range of motion   PT Next Visit Plan Continue progressive strengthening for Rt shoulder girdle and posture   Consulted and Agree with Plan of Care Patient        Problem List Patient Active Problem List   Diagnosis Date Noted  . Healthcare-associated pneumonia 08/13/2012  . Hypertension 08/12/2012    Kerin Perna, PTA 12/10/2014 10:39 AM  [facility] [place], [place], [address] Phone: [phone]   Fax:  [phone]

## 2014-12-10 NOTE — Patient Instructions (Signed)
Arm: External Rotation   Stand facing tubing at elbow height, elbow at side, forearm across body. Pull forearm away from body as far as possible, keeping elbow at side. Move slowly and deliberately. Repeat _10___ times.  2-3 sets, every other day.    [facility] [place] [address], [place] [address]  [phone] (office) [phone] (fax)

## 2014-12-14 ENCOUNTER — Ambulatory Visit (INDEPENDENT_AMBULATORY_CARE_PROVIDER_SITE_OTHER): Payer: 59 | Admitting: Physical Therapy

## 2014-12-14 ENCOUNTER — Encounter (INDEPENDENT_AMBULATORY_CARE_PROVIDER_SITE_OTHER): Payer: Self-pay

## 2014-12-14 DIAGNOSIS — M25511 Pain in right shoulder: Secondary | ICD-10-CM

## 2014-12-14 DIAGNOSIS — M25611 Stiffness of right shoulder, not elsewhere classified: Secondary | ICD-10-CM | POA: Diagnosis not present

## 2014-12-14 DIAGNOSIS — G8929 Other chronic pain: Secondary | ICD-10-CM

## 2014-12-14 NOTE — Therapy (Signed)
[place], [place], [address] Phone: [phone]   Fax:  [phone]  Physical Therapy Treatment  Patient Details  Name: Cody Graham MRN: [id number] Date of Birth: 05-29-1976 Referring Provider:  Leandrew Koyanagi, MD  Encounter Date: 12/14/2014      PT End of Session - 12/14/14 0853    Visit Number 4   Number of Visits 8   Date for PT Re-Evaluation 01/17/15   PT Start Time 0851   PT Stop Time 0945   PT Time Calculation (min) 54 min   Activity Tolerance Patient tolerated treatment well      Past Medical History  Diagnosis Date  . Hypertension   . Pneumonia 08/11/2012    Past Surgical History  Procedure Laterality Date  . Appendectomy    . Colonoscopy  2012    There were no vitals filed for this visit.  Visit Diagnosis:  Right anterior shoulder pain  Stiffness of joint, shoulder region, right  Chronic shoulder pain, right      Subjective Assessment - 12/14/14 0853    Subjective Pt reports he is experiencing more "popping" in Rt shoulder since last visit.    Currently in Pain? No/denies  during ther ex, up to 3/10              [facility] Adult PT Treatment/Exercise - 12/14/14 0001    Shoulder Exercises: Standing   Protraction Strengthening;Right;10 reps;Theraband  (forward punch)    Theraband Level (Shoulder Protraction) Level 3 (Green)   External Rotation Strengthening;Right;Left;15 reps;Theraband  2 sets    Theraband Level (Shoulder External Rotation) Level 3 (Green)   Flexion Strengthening;Right;Left;10 reps;Weights  2 sets   Shoulder Flexion Weight (lbs) 3   ABduction Strengthening;Right;Left;10 reps  scaption, 2 sets    Shoulder ABduction Weight (lbs) 3   Extension Strengthening;Both;10 reps;Theraband  2 sets   Theraband Level (Shoulder Extension) Level 3 (Green)   Row Strengthening;Both;10 reps  2 sets   Theraband Level (Shoulder Row) Level 3 (Green)   Shoulder  Exercises: ROM/Strengthening   UBE (Upper Arm Bike) L5: 2 min each way.    Wall Pushups 10 reps  elbows in   Shoulder Exercises: Stretch   Corner Stretch 3 reps;30 seconds  Doorway   Shoulder Exercises: Body Blade   Flexion 15 seconds;2 reps   ABduction 15 seconds;2 reps   ABduction Limitations (scaption)    Vasopneumatic   Number Minutes Vasopneumatic  15 minutes   Vasopnuematic Location  Shoulder   Vasopneumatic Pressure Medium   Vasopneumatic Temperature  3*                  PT Short Term Goals - 12/14/14 6767    PT SHORT TERM GOAL #1   Title Independent in HEP with correct technique   Time 3   Period Weeks   Status On-going   PT SHORT TERM GOAL #2   Title Improve posture and alignment - assessed in standing   Time 3   Period Weeks   Status On-going   PT SHORT TERM GOAL #3   Title Decrease pain in Rt shoulder to 3-4/10    Time 4   Period Weeks   Status Achieved           PT Long Term Goals - 12/08/14 1042    PT LONG TERM GOAL #1   Title Increase Rt shoulder AROM by 5-10 degrees in all limited planes   Time 6  Period Weeks   Status On-going   PT LONG TERM GOAL #2   Title Improve functional activity level allowing patient to reach behind back without pain    Time 8   Period Weeks   Status On-going   PT LONG TERM GOAL #3   Title Decrease FOTO to 25 or less   Time 8   Period Weeks   Status On-going               Plan - 12/14/14 0932    Clinical Impression Statement Pt tolerated all exercises with minimal increase in Rt shoulder pain. Pt completing exercises on Lt side as well to ensure = strength. Pt has met STG #3.    Pt will benefit from skilled therapeutic intervention in order to improve on the following deficits Decreased activity tolerance;Decreased range of motion;Hypomobility;Decreased mobility;Impaired UE functional use   Rehab Potential Excellent   PT Frequency 2x / week   PT Duration 8 weeks   PT Treatment/Interventions  ADLs/Self Care Home Management;Cryotherapy;Electrical Stimulation;Iontophoresis 51m/ml Dexamethasone;Moist Heat;Ultrasound;Therapeutic activities;Therapeutic exercise;Neuromuscular re-education;Patient/family education;Manual techniques;Passive range of motion   PT Next Visit Plan Continue progressive strengthening for Rt shoulder girdle and posture   Consulted and Agree with Plan of Care Patient        Problem List Patient Active Problem List   Diagnosis Date Noted  . Healthcare-associated pneumonia 08/13/2012  . Hypertension 08/12/2012   JKerin Perna PTA 12/14/2014 9:35 AM  [email or web address] [address]Phone: [phone]  Fax:  [phone]

## 2014-12-22 ENCOUNTER — Encounter: Payer: 59 | Admitting: Rehabilitative and Restorative Service Providers"

## 2014-12-24 ENCOUNTER — Ambulatory Visit (INDEPENDENT_AMBULATORY_CARE_PROVIDER_SITE_OTHER): Payer: 59 | Admitting: Physical Therapy

## 2014-12-24 DIAGNOSIS — M25511 Pain in right shoulder: Secondary | ICD-10-CM

## 2014-12-24 DIAGNOSIS — G8929 Other chronic pain: Secondary | ICD-10-CM | POA: Diagnosis not present

## 2014-12-24 DIAGNOSIS — M25611 Stiffness of right shoulder, not elsewhere classified: Secondary | ICD-10-CM | POA: Diagnosis not present

## 2014-12-24 NOTE — Therapy (Signed)
[place], [place], [address] Phone: [phone]   Fax:  [phone]  Physical Therapy Treatment  Patient Details  Name: Cody Graham MRN: [id number] Date of Birth: 03/01/1976 Referring Provider:  Leandrew Koyanagi, MD  Encounter Date: 12/24/2014      PT End of Session - 12/24/14 0937    Visit Number 5   Number of Visits 8   Date for PT Re-Evaluation 01/17/15   PT Start Time 0933   PT Stop Time 1026   PT Time Calculation (min) 53 min      Past Medical History  Diagnosis Date  . Hypertension   . Pneumonia 08/11/2012    Past Surgical History  Procedure Laterality Date  . Appendectomy    . Colonoscopy  2012    There were no vitals filed for this visit.  Visit Diagnosis:  Stiffness of joint, shoulder region, right  Right anterior shoulder pain  Chronic shoulder pain, right      Subjective Assessment - 12/24/14 0937    Subjective Pt presents with small bruise in Rt bicep. Still experiencing popping occasionly.  IR towel stretch still difficult, painful.  Pt feels getting a little stronger.    Currently in Pain? No/denies  0/10 rest, 2/10 with stretch/exercises.    Pain Location Shoulder   Pain Orientation Right   Pain Descriptors / Indicators Sore            [facility] PT Assessment - 12/24/14 0001    ROM / Strength   AROM / PROM / Strength AROM   AROM   AROM Assessment Site Shoulder   Right/Left Shoulder Right;Left   Right Shoulder Extension 47 Degrees   Right Shoulder Flexion 152 Degrees  standing, with "ache"   Right Shoulder ABduction 145 Degrees  standing   Right Shoulder Internal Rotation --  6" difference with arm behind back   Left Shoulder Extension 49 Degrees   Left Shoulder Flexion 164 Degrees   Left Shoulder ABduction 158 Degrees                     [facility] Adult PT Treatment/Exercise - 12/24/14 0001    Shoulder Exercises: Standing   External  Rotation Strengthening;Right;Left;12 reps   Theraband Level (Shoulder External Rotation) Level 4 (Blue)   Flexion Strengthening;Right;Left;10 reps;Weights   Shoulder Flexion Weight (lbs) 4, 5    ABduction Strengthening;Right;Left;10 reps  scaption   Shoulder ABduction Weight (lbs) 3, 4   Extension Strengthening;Both;20 reps;Theraband   Theraband Level (Shoulder Extension) Level 4 (Blue)   Row Strengthening;Both;10 reps;Theraband  10 with each blue and green   Theraband Level (Shoulder Row) Level 3 (Green);Level 4 (Blue)   Shoulder Exercises: ROM/Strengthening   UBE (Upper Arm Bike) L5: 2 min each way.    Wall Pushups 10 reps  2 sets, elbows in with plus, elbows out   Shoulder Exercises: Stretch   Corner Stretch 3 reps;30 seconds  Doorway   Shoulder Exercises: Body Blade   Flexion 30 seconds;2 reps   ABduction 15 seconds;2 reps   ABduction Limitations (scaption)   Cryotherapy   Number Minutes Cryotherapy 12 Minutes   Cryotherapy Location Shoulder   Type of Cryotherapy Ice pack                  PT Short Term Goals - 12/14/14 9767    PT SHORT TERM GOAL #1   Title Independent in HEP with correct technique   Time  3   Period Weeks   Status On-going   PT SHORT TERM GOAL #2   Title Improve posture and alignment - assessed in standing   Time 3   Period Weeks   Status On-going   PT SHORT TERM GOAL #3   Title Decrease pain in Rt shoulder to 3-4/10    Time 4   Period Weeks   Status Achieved           PT Long Term Goals - 12/24/14 1011    PT LONG TERM GOAL #1   Title Increase Rt shoulder AROM by 5-10 degrees in all limited planes   Time 6   Period Weeks   Status Partially Met   PT LONG TERM GOAL #2   Title Improve functional activity level allowing patient to reach behind back without pain    Time 8   Period Weeks   Status Partially Met   PT LONG TERM GOAL #3   Title Decrease FOTO to 25 or less   Time 8   Period Weeks   Status On-going                Plan - 12/24/14 1012    Clinical Impression Statement Pt demo improved Rt shoulder ROM this visit, still limited IR.  Pt tolerated increased resistance without increase in pain. Audible popping with some abduction exercises, ceased when changed to scaption plane.  Progressing towards towards goals.    Pt will benefit from skilled therapeutic intervention in order to improve on the following deficits Decreased activity tolerance;Decreased range of motion;Hypomobility;Decreased mobility;Impaired UE functional use   Rehab Potential Excellent   PT Frequency 2x / week   PT Duration 8 weeks   PT Treatment/Interventions ADLs/Self Care Home Management;Cryotherapy;Electrical Stimulation;Iontophoresis 37m/ml Dexamethasone;Moist Heat;Ultrasound;Therapeutic activities;Therapeutic exercise;Neuromuscular re-education;Patient/family education;Manual techniques;Passive range of motion   PT Next Visit Plan Continue progressive strengthening for Rt shoulder girdle and posture.     Consulted and Agree with Plan of Care Patient        Problem List Patient Active Problem List   Diagnosis Date Noted  . Healthcare-associated pneumonia 08/13/2012  . Hypertension 08/12/2012    JKerin Perna PTA 12/24/2014 10:56 AM  [facility] [place] [address]Phone: [phone]  Fax:  [phone]

## 2015-01-19 ENCOUNTER — Ambulatory Visit (INDEPENDENT_AMBULATORY_CARE_PROVIDER_SITE_OTHER): Payer: 59 | Admitting: Physical Therapy

## 2015-01-19 DIAGNOSIS — M25611 Stiffness of right shoulder, not elsewhere classified: Secondary | ICD-10-CM

## 2015-01-19 DIAGNOSIS — M25511 Pain in right shoulder: Secondary | ICD-10-CM | POA: Diagnosis not present

## 2015-01-19 NOTE — Therapy (Addendum)
[place], [place], [address] Phone: [phone]   Fax:  [phone]  Physical Therapy Treatment  Patient Details  Name: Cody Graham MRN: [id number] Date of Birth: 06-01-1976 Referring Provider:  Leandrew Koyanagi, MD  Encounter Date: 01/19/2015      PT End of Session - 01/19/15 1030    Visit Number 6   Number of Visits 8   PT Start Time 1024   PT Stop Time 1114   PT Time Calculation (min) 50 min   Activity Tolerance Patient tolerated treatment well      Past Medical History  Diagnosis Date  . Hypertension   . Pneumonia 08/11/2012    Past Surgical History  Procedure Laterality Date  . Appendectomy    . Colonoscopy  2012    There were no vitals filed for this visit.  Visit Diagnosis:  Stiffness of joint, shoulder region, right  Right anterior shoulder pain      Subjective Assessment - 01/19/15 1030    Subjective Pt reported he's been away from therapy due to family illness/ etc.  Pt reports he has been mostly painfree, except when "overdoing it" with golf or attempting to throw rocks with son into creek.  Only complaint is difficulty with abd ER (to throw ball).    Currently in Pain? No/denies            [facility] PT Assessment - 01/19/15 0001    Observation/Other Assessments   Focus on Therapeutic Outcomes (FOTO)  7% limited   AROM   AROM Assessment Site Shoulder   Right/Left Shoulder Right   Right Shoulder Extension 49 Degrees   Right Shoulder Flexion 160 Degrees   Right Shoulder ABduction 148 Degrees   Right Shoulder Internal Rotation 45 Degrees  (abd to 90 deg in supine)    Right Shoulder External Rotation 90 Degrees   Strength   Strength Assessment Site Shoulder   Right/Left Shoulder Right   Right Shoulder Flexion 5/5   Right Shoulder Extension 5/5   Right Shoulder ABduction --  5-/5   Right Shoulder Internal Rotation 5/5   Right Shoulder External Rotation 5/5                      [facility] Adult PT Treatment/Exercise - 01/19/15 0001    Shoulder Exercises: Standing   External Rotation Strengthening;Right;15 reps   Theraband Level (Shoulder External Rotation) Level 4 (Blue)   Flexion Strengthening;Right;10 reps   Theraband Level (Shoulder Flexion) Level 3 (Green)   ABduction Strengthening;Right;10 reps;Theraband  2 sets, 1 with green, 1 with red band   Theraband Level (Shoulder ABduction) Level 2 (Red);Level 3 (Green)   Extension Right;15 reps;Strengthening   Theraband Level (Shoulder Extension) Level 4 (Blue)   Other Standing Exercises rebounder with red ball 2#, and green ball 1# x 20 throws (no pain)    Shoulder Exercises: ROM/Strengthening   UBE (Upper Arm Bike) L5: 3 min each direction    Other ROM/Strengthening Exercises floor push up x 10    Shoulder Exercises: Stretch   Corner Stretch 3 reps;30 seconds  Doorway, 2 sets Arm behind back fingers laced x 20 sec x 2 reps   Shoulder Exercises: Body Blade   Flexion 30 seconds;2 reps   ABduction 30 seconds;2 reps   Modalities   Modalities Cryotherapy   Cryotherapy   Number Minutes Cryotherapy 12 Minutes   Cryotherapy Location Shoulder   Type of Cryotherapy Ice pack  PT Education - 01/19/15 1406    Education provided Yes   Education Details HEP- added shoulder abd with red band x 10 x 3 sets.    Person(s) Educated Patient   Methods Explanation;Demonstration   Comprehension Returned demonstration;Verbalized understanding          PT Short Term Goals - 01/19/15 1416    PT SHORT TERM GOAL #1   Title Independent in HEP with correct technique   Time 3   Period Weeks   Status Achieved   PT SHORT TERM GOAL #2   Title Improve posture and alignment - assessed in standing   Time 3   Period Weeks   Status Achieved   PT SHORT TERM GOAL #3   Title Decrease pain in Rt shoulder to 3-4/10    Time 4   Period Weeks   Status Achieved           PT  Long Term Goals - 01/19/15 1415    PT LONG TERM GOAL #1   Title Increase Rt shoulder AROM by 5-10 degrees in all limited planes   Time 6   Period Weeks   Status Achieved   PT LONG TERM GOAL #2   Title Improve functional activity level allowing patient to reach behind back without pain    Time 8   Period Weeks   Status Achieved   PT LONG TERM GOAL #3   Title Decrease FOTO to 25 or less   Time 8   Period Weeks   Status Achieved               Plan - 01/19/15 1405    Clinical Impression Statement Pt demo improved Rt shoulder ROM.  Pt has met all of his goals and is pleased with his current level of function.  Pt interested in d/c to advanced HEP.    Pt will benefit from skilled therapeutic intervention in order to improve on the following deficits Decreased activity tolerance;Decreased range of motion;Hypomobility;Decreased mobility;Impaired UE functional use   Rehab Potential Excellent   PT Treatment/Interventions ADLs/Self Care Home Management;Cryotherapy;Electrical Stimulation;Iontophoresis 6m/ml Dexamethasone;Moist Heat;Ultrasound;Therapeutic activities;Therapeutic exercise;Neuromuscular re-education;Patient/family education;Manual techniques;Passive range of motion   PT Next Visit Plan Spoke to supervising PT; will d/c to HEP.    Consulted and Agree with Plan of Care Patient        Problem List Patient Active Problem List   Diagnosis Date Noted  . Healthcare-associated pneumonia 08/13/2012  . Hypertension 08/12/2012    JKerin Perna PTA 01/19/2015 2:31 PM  [address] [address]Phone: [phone]  Fax:  [phone]    PHYSICAL THERAPY DISCHARGE SUMMARY  Visits from Start of Care: 6  Current functional level related to goals / functional outcomes: See above   Remaining deficits: No functional deficits, pt at baseline with shoulder ROM   Education /  Equipment: HEP Plan: Patient agrees to discharge.  Patient goals were met. Patient is being discharged due to being pleased with the current functional level.  ?????    SJeral Pinch PT 01/19/2015 3:55 PM

## 2015-07-26 MED FILL — LOSARTAN POTASSIUM 25 MG TA: 25 | 90 days supply | Qty: 90 | Fill #1

## 2015-09-09 MED FILL — ATORVASTATIN 10 MG TABLET: 10 | 90 days supply | Qty: 90 | Fill #1

## 2015-10-05 ENCOUNTER — Emergency Department (HOSPITAL_COMMUNITY)
Admission: EM | Admit: 2015-10-05 | Discharge: 2015-10-05 | Disposition: A | Discharge status: Home / Self Care | Payer: 59 | Attending: Emergency Medicine | Admitting: Emergency Medicine

## 2015-10-05 ENCOUNTER — Emergency Department (HOSPITAL_COMMUNITY): Payer: 59

## 2015-10-05 ENCOUNTER — Encounter (HOSPITAL_COMMUNITY): Payer: Self-pay | Admitting: Emergency Medicine

## 2015-10-05 DIAGNOSIS — E7800 Pure hypercholesterolemia, unspecified: Secondary | ICD-10-CM | POA: Diagnosis not present

## 2015-10-05 DIAGNOSIS — E785 Hyperlipidemia, unspecified: Secondary | ICD-10-CM | POA: Insufficient documentation

## 2015-10-05 DIAGNOSIS — M546 Pain in thoracic spine: Secondary | ICD-10-CM | POA: Diagnosis not present

## 2015-10-05 DIAGNOSIS — I10 Essential (primary) hypertension: Secondary | ICD-10-CM | POA: Insufficient documentation

## 2015-10-05 DIAGNOSIS — R0789 Other chest pain: Secondary | ICD-10-CM | POA: Diagnosis not present

## 2015-10-05 DIAGNOSIS — R079 Chest pain, unspecified: Secondary | ICD-10-CM | POA: Diagnosis not present

## 2015-10-05 DIAGNOSIS — Z8701 Personal history of pneumonia (recurrent): Secondary | ICD-10-CM | POA: Insufficient documentation

## 2015-10-05 DIAGNOSIS — Z792 Long term (current) use of antibiotics: Secondary | ICD-10-CM | POA: Diagnosis not present

## 2015-10-05 DIAGNOSIS — Z79899 Other long term (current) drug therapy: Secondary | ICD-10-CM | POA: Insufficient documentation

## 2015-10-05 LAB — CBC
HEMATOCRIT: 43.9 % (ref 39.0–52.0)
Hemoglobin: 15.3 g/dL (ref 13.0–17.0)
MCH: 31.4 pg (ref 26.0–34.0)
MCHC: 34.9 g/dL (ref 30.0–36.0)
MCV: 90 fL (ref 78.0–100.0)
PLATELETS: 200 10*3/uL (ref 150–400)
RBC: 4.88 MIL/uL (ref 4.22–5.81)
RDW: 12.5 % (ref 11.5–15.5)
WBC: 5.5 10*3/uL (ref 4.0–10.5)

## 2015-10-05 LAB — BASIC METABOLIC PANEL
ANION GAP: 10 (ref 5–15)
BUN: 21 mg/dL — AB (ref 6–20)
CALCIUM: 9.7 mg/dL (ref 8.9–10.3)
CO2: 25 mmol/L (ref 22–32)
CREATININE: 1.27 mg/dL — AB (ref 0.61–1.24)
Chloride: 107 mmol/L (ref 101–111)
GFR calc Af Amer: >60 mL/min (ref >60)
GLUCOSE: 98 mg/dL (ref 65–99)
Potassium: 4 mmol/L (ref 3.5–5.1)
Sodium: 142 mmol/L (ref 135–145)

## 2015-10-05 LAB — I-STAT TROPONIN, ED
Troponin i, poc: 0 ng/mL (ref 0.00–0.08)
Troponin i, poc: 0 ng/mL (ref 0.00–0.08)

## 2015-10-05 MED ORDER — ASPIRIN EC 325 MG PO TBEC
325.0000 mg | DELAYED_RELEASE_TABLET | Freq: Once | ORAL | Status: AC
  Administered 2015-10-05: 325 mg via ORAL
  Filled 2015-10-05: qty 1

## 2015-10-05 NOTE — ED Provider Notes (Addendum)
CSN: [id number]     Arrival date & time 10/05/15  1518 History   First MD Initiated Contact with Patient 10/05/15 1522     Chief Complaint  Patient presents with  . Chest Pain      HPI  40 y.o. male with history of hypertension and hyperlipidemia presenting with chest pain present intermittently for the last 2 days. Patient describes a sharp pain over the left chest sometimes radiating to the right chest and around his left side his back. Pain is 0 out of 10 at baseline, when it occurs lasts for several seconds and then completely resolves. Tends to occur when he is exerting himself doing yard work or at work. Pain in the right side of the chest can be reproduced with certain movements, however pain in the left side of the chest and the back is not reproducible. Pain is worse with deep inspiration. Denies shortness of breath. No nausea, vomiting, abdominal pain, lightheadedness, dizziness, headache. Patient denies cardiac history. He's never had a stress test.   Past Medical History  Diagnosis Date  . Hypertension   . Pneumonia 08/11/2012  . Elevated cholesterol    Past Surgical History  Procedure Laterality Date  . Appendectomy    . Colonoscopy  2012   No family history on file. Social History  Substance Use Topics  . Smoking status: Never Smoker   . Smokeless tobacco: Never Used  . Alcohol Use: Yes    Review of Systems  Constitutional: Negative for fever, chills, activity change and appetite change.  HENT: Negative for congestion, facial swelling, rhinorrhea and sore throat.   Eyes: Negative for visual disturbance.  Respiratory: Negative for cough, shortness of breath and wheezing.   Cardiovascular: Positive for chest pain. Negative for palpitations and leg swelling.  Gastrointestinal: Negative for nausea, vomiting and abdominal pain.  Genitourinary: Negative for dysuria, frequency, hematuria, flank pain and difficulty urinating.  Musculoskeletal: Positive for back pain.  Negative for myalgias, joint swelling, arthralgias, neck pain and neck stiffness.  Skin: Negative for rash.  Neurological: Negative for dizziness, weakness, light-headedness and headaches.  Psychiatric/Behavioral: Negative for behavioral problems, confusion and agitation.      Allergies  Lisinopril  Home Medications   Prior to Admission medications   Medication Sig Start Date End Date Taking? Authorizing Provider  atorvastatin (LIPITOR) 10 MG tablet Take 10 mg by mouth daily. 09/09/15  Yes Historical Provider, MD  ibuprofen (ADVIL,MOTRIN) 200 MG tablet Take 800 mg by mouth every 8 (eight) hours as needed. For pain   Yes Historical Provider, MD  losartan (COZAAR) 25 MG tablet Take 1 tablet by mouth daily. 07/26/15  Yes Historical Provider, MD  chlorpheniramine-HYDROcodone (TUSSIONEX PENNKINETIC ER) 10-8 MG/5ML LQCR Take 5 mLs by mouth every 12 (twelve) hours as needed. Patient not taking: Reported on 11/29/2014 08/13/12   Melton Alar, PA-C  levofloxacin (LEVAQUIN) 750 MG tablet Take 1 tablet (750 mg total) by mouth daily. 08/13/12   Marianne L York, PA-C   BP 120/104 mmHg  Pulse 63  Temp(Src) 98.5 F (36.9 C) (Oral)  Resp 19  SpO2 97% Physical Exam  Constitutional: He is oriented to person, place, and time. He appears well-developed and well-nourished. No distress.  HENT:  Head: Normocephalic and atraumatic.  Right Ear: External ear normal.  Left Ear: External ear normal.  Nose: Nose normal.  Mouth/Throat: Oropharynx is clear and moist. No oropharyngeal exudate.  Eyes: Conjunctivae are normal. Pupils are equal, round, and reactive to light. Right eye exhibits  no discharge. Left eye exhibits no discharge. No scleral icterus.  Neck: Normal range of motion. Neck supple. No tracheal deviation present.  Cardiovascular: Normal rate, regular rhythm, normal heart sounds and intact distal pulses.  Exam reveals no gallop and no friction rub.   No murmur heard. Pulmonary/Chest: Effort normal  and breath sounds normal. No respiratory distress. He has no wheezes. He has no rales.  Abdominal: Soft. Bowel sounds are normal. He exhibits no distension and no mass. There is no tenderness. There is no rebound and no guarding.  Musculoskeletal: Normal range of motion. He exhibits tenderness (TTP to the left side of the back over the scapula). He exhibits no edema.  Neurological: He is alert and oriented to person, place, and time. No cranial nerve deficit. Coordination normal.  Skin: Skin is warm and dry. No rash noted. He is not diaphoretic.  Psychiatric: He has a normal mood and affect. His behavior is normal. Judgment and thought content normal.    ED Course  Procedures (including critical care time) Labs Review Labs Reviewed  BASIC METABOLIC PANEL - Abnormal; Notable for the following:    BUN 21 (*)    Creatinine, Ser 1.27 (*)    All other components within normal limits  CBC  I-STAT TROPOININ, ED  Randolm Idol, ED    Imaging Review Dg Chest 2 View  10/05/2015  CLINICAL DATA:  Left-sided chest and back pain for 1 week. Abnormal EKG. Hypercholesterolemia and hypertension. EXAM: CHEST  2 VIEW COMPARISON:  08/12/2012 FINDINGS: The heart size and mediastinal contours are within normal limits. Both lungs are clear. The visualized skeletal structures are unremarkable. IMPRESSION: Negative.  No active cardiopulmonary disease. Electronically Signed   By: Earle Gell M.D.   On: 10/05/2015 15:55   I have personally reviewed and evaluated these images and lab results as part of my medical decision-making.   EKG Interpretation   Date/Time:  Wednesday October 05 2015 15:23:50 EDT Ventricular Rate:  77 PR Interval:  149 QRS Duration: 100 QT Interval:  374 QTC Calculation: 423 R Axis:   64 Text Interpretation:  Sinus rhythm Nonspecific T wave abnormality No  previous tracing Confirmed by Ashok Cordia  MD, Lennette Bihari ([id number]) on 10/05/2015  3:28:28 PM      MDM   Final diagnoses:  Other  chest pain    Generally well-appearing. Vital signs stable. Patient is PERC negative and I doubt PE. History and description of pain not consistent with aortic dissection. EKG with nonspecific T-wave abnormality. Delta troponin 0. Patient denies chest pain on reassessment. Creatinine elevated but at the patient's baseline. No indication of pneumonia on chest x-ray. Doubt underlying cardiac etiology. I recommend outpatient stress test. Patient given referral to cardiology. Recommend return to the ED for new or worsening symptoms.    Jenifer Algernon Huxley, MD 10/06/15 0221   I saw and evaluated the patient, reviewed the resident's note and I agree with the findings and plan.   EKG Interpretation   Date/Time:  Wednesday October 05 2015 15:23:50 EDT Ventricular Rate:  77 PR Interval:  149 QRS Duration: 100 QT Interval:  374 QTC Calculation: 423 R Axis:   64 Text Interpretation:  Sinus rhythm Nonspecific T wave abnormality No  previous tracing Confirmed by Ashok Cordia  MD, Lennette Bihari ([id number]) on 10/05/2015  3:28:28 PM      Pt c/o mid back pain, from recent strain/injury. Pain will wrap around to chest, sharp, brief, seconds duration. No constant and/or pleuritic chest pain. No exertional cp. Chest  cta. Labs. Imaging.  Lajean Saver, MD 10/06/15 [place], MD 10/06/15 [phone]

## 2015-10-05 NOTE — Discharge Instructions (Signed)
Nonspecific Chest Pain  °Chest pain can be caused by many different conditions. There is always a chance that your pain could be related to something serious, such as a heart attack or a blood clot in your lungs. Chest pain can also be caused by conditions that are not life-threatening. If you have chest pain, it is very important to follow up with your health care provider. °CAUSES  °Chest pain can be caused by: °· Heartburn. °· Pneumonia or bronchitis. °· Anxiety or stress. °· Inflammation around your heart (pericarditis) or lung (pleuritis or pleurisy). °· A blood clot in your lung. °· A collapsed lung (pneumothorax). It can develop suddenly on its own (spontaneous pneumothorax) or from trauma to the chest. °· Shingles infection (varicella-zoster virus). °· Heart attack. °· Damage to the bones, muscles, and cartilage that make up your chest wall. This can include: °¨ Bruised bones due to injury. °¨ Strained muscles or cartilage due to frequent or repeated coughing or overwork. °¨ Fracture to one or more ribs. °¨ Sore cartilage due to inflammation (costochondritis). °RISK FACTORS  °Risk factors for chest pain may include: °· Activities that increase your risk for trauma or injury to your chest. °· Respiratory infections or conditions that cause frequent coughing. °· Medical conditions or overeating that can cause heartburn. °· Heart disease or family history of heart disease. °· Conditions or health behaviors that increase your risk of developing a blood clot. °· Having had chicken pox (varicella zoster). °SIGNS AND SYMPTOMS °Chest pain can feel like: °· Burning or tingling on the surface of your chest or deep in your chest. °· Crushing, pressure, aching, or squeezing pain. °· Dull or sharp pain that is worse when you move, cough, or take a deep breath. °· Pain that is also felt in your back, neck, shoulder, or arm, or pain that spreads to any of these areas. °Your chest pain may come and go, or it may stay  constant. °DIAGNOSIS °Lab tests or other studies may be needed to find the cause of your pain. Your health care provider may have you take a test called an ambulatory ECG (electrocardiogram). An ECG records your heartbeat patterns at the time the test is performed. You may also have other tests, such as: °· Transthoracic echocardiogram (TTE). During echocardiography, sound waves are used to create a picture of all of the heart structures and to look at how blood flows through your heart. °· Transesophageal echocardiogram (TEE). This is a more advanced imaging test that obtains images from inside your body. It allows your health care provider to see your heart in finer detail. °· Cardiac monitoring. This allows your health care provider to monitor your heart rate and rhythm in real time. °· Holter monitor. This is a portable device that records your heartbeat and can help to diagnose abnormal heartbeats. It allows your health care provider to track your heart activity for several days, if needed. °· Stress tests. These can be done through exercise or by taking medicine that makes your heart beat more quickly. °· Blood tests. °· Imaging tests. °TREATMENT  °Your treatment depends on what is causing your chest pain. Treatment may include: °· Medicines. These may include: °¨ Acid blockers for heartburn. °¨ Anti-inflammatory medicine. °¨ Pain medicine for inflammatory conditions. °¨ Antibiotic medicine, if an infection is present. °¨ Medicines to dissolve blood clots. °¨ Medicines to treat coronary artery disease. °· Supportive care for conditions that do not require medicines. This may include: °¨ Resting. °¨ Applying heat   or cold packs to injured areas. °¨ Limiting activities until pain decreases. °HOME CARE INSTRUCTIONS °· If you were prescribed an antibiotic medicine, finish it all even if you start to feel better. °· Avoid any activities that bring on chest pain. °· Do not use any tobacco products, including  cigarettes, chewing tobacco, or electronic cigarettes. If you need help quitting, ask your health care provider. °· Do not drink alcohol. °· Take medicines only as directed by your health care provider. °· Keep all follow-up visits as directed by your health care provider. This is important. This includes any further testing if your chest pain does not go away. °· If heartburn is the cause for your chest pain, you may be told to keep your head raised (elevated) while sleeping. This reduces the chance that acid will go from your stomach into your esophagus. °· Make lifestyle changes as directed by your health care provider. These may include: °¨ Getting regular exercise. Ask your health care provider to suggest some activities that are safe for you. °¨ Eating a heart-healthy diet. A registered dietitian can help you to learn healthy eating options. °¨ Maintaining a healthy weight. °¨ Managing diabetes, if necessary. °¨ Reducing stress. °SEEK MEDICAL CARE IF: °· Your chest pain does not go away after treatment. °· You have a rash with blisters on your chest. °· You have a fever. °SEEK IMMEDIATE MEDICAL CARE IF:  °· Your chest pain is worse. °· You have an increasing cough, or you cough up blood. °· You have severe abdominal pain. °· You have severe weakness. °· You faint. °· You have chills. °· You have sudden, unexplained chest discomfort. °· You have sudden, unexplained discomfort in your arms, back, neck, or jaw. °· You have shortness of breath at any time. °· You suddenly start to sweat, or your skin gets clammy. °· You feel nauseous or you vomit. °· You suddenly feel light-headed or dizzy. °· Your heart begins to beat quickly, or it feels like it is skipping beats. °These symptoms may represent a serious problem that is an emergency. Do not wait to see if the symptoms will go away. Get medical help right away. Call your local emergency services (911 in the [place] Do not drive yourself to the hospital. °  °This  information is not intended to replace advice given to you by your health care provider. Make sure you discuss any questions you have with your health care provider. °  °Document Released: 04/04/2005 Document Revised: 07/16/2014 Document Reviewed: 01/29/2014 °Elsevier Interactive Patient Education ©2016 [employer] Inc. ° °

## 2015-10-05 NOTE — ED Notes (Signed)
Pt reports intermittent CP which starts in his back and radiates to the left side. Pt also report sharp right sided CP. NAD at this time.

## 2015-10-11 ENCOUNTER — Encounter: Payer: Self-pay | Admitting: Physician Assistant

## 2015-10-11 ENCOUNTER — Ambulatory Visit (INDEPENDENT_AMBULATORY_CARE_PROVIDER_SITE_OTHER): Payer: 59 | Admitting: Physician Assistant

## 2015-10-11 VITALS — BP 126/80 | HR 70 | Ht 76.0 in | Wt 260.2 lb

## 2015-10-11 DIAGNOSIS — E785 Hyperlipidemia, unspecified: Secondary | ICD-10-CM | POA: Diagnosis not present

## 2015-10-11 DIAGNOSIS — R079 Chest pain, unspecified: Secondary | ICD-10-CM | POA: Diagnosis not present

## 2015-10-11 DIAGNOSIS — R1013 Epigastric pain: Secondary | ICD-10-CM | POA: Diagnosis not present

## 2015-10-11 DIAGNOSIS — E782 Mixed hyperlipidemia: Secondary | ICD-10-CM | POA: Diagnosis not present

## 2015-10-11 DIAGNOSIS — I10 Essential (primary) hypertension: Secondary | ICD-10-CM

## 2015-10-11 MED FILL — LANSOPRAZOLE DR 30 MG CAP: 30 | 30 days supply | Qty: 30 | Fill #0

## 2015-10-11 NOTE — Patient Instructions (Addendum)
Medication Instructions:  Your physician recommends that you continue on your current medications as directed. Please refer to the Current Medication list given to you today.  Labwork: NONE  Testing/Procedures: Your physician has requested that you have an exercise tolerance test. For further information please visit HugeFiesta.[email or web address]. Please also follow instruction sheet, as given.  Follow-Up: DR. Acie Fredrickson AS NEEDED  Any Other Special Instructions Will Be Listed Below (If Applicable).  If you need a refill on your cardiac medications before your next appointment, please call your pharmacy.

## 2015-10-11 NOTE — Progress Notes (Signed)
Cardiology Office Note:    Date:  10/11/2015   ID:  Cody Graham, DOB [birth year], MRN [id number]  PCP:  Hardie Lora, MD  Cardiologist:  New - Dr. Liam Rogers   Electrophysiologist:  n/a  Referring MD - Dr. Ashok Cordia ([place])   Chief Complaint  Patient presents with  . 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  He played a lot of sports in high school and some in junior college (baseball, basketball).  He has a lot joint pain, especially in his shoulders.  He did a lot of yard work recently and is building a tree house for his child.  He felt like he may have aggravated something in his shoulder.  He then started to have some pain in his R chest and then in his L chest and back. Also noted some epigastric pain related to meals.  He feels symptoms throughout the day.  He seems to have no symptoms with rest.  Denies dyspnea, orthopnea, PND, edema.  No syncope.  Denies pleuritic chest pain.  He got an ECG at work and there were TWI in 3 and aVF.  His wife asked him to be seen and he checked in at the ED 3/29.  He saw his PCP this AM who put him on Prevacid.     Past Medical History  Diagnosis Date  . Hypertension 2015    HCTZ dc'd 2/2 gout  . Pneumonia 08/11/2012  . HLD (hyperlipidemia)   . DJD (degenerative joint disease)     shoulder    Past Surgical History  Procedure Laterality Date  . Appendectomy    . Colonoscopy  2012    Current Medications: Outpatient Prescriptions Prior to Visit  Medication Sig Dispense Refill  . atorvastatin (LIPITOR) 10 MG tablet Take 10 mg by mouth daily.  5  . ibuprofen (ADVIL,MOTRIN) 200 MG tablet Take 800 mg by mouth every 8 (eight) hours as needed. For pain    . losartan (COZAAR) 25 MG tablet Take 1 tablet by mouth daily.  5  . chlorpheniramine-HYDROcodone (TUSSIONEX PENNKINETIC ER) 10-8 MG/5ML LQCR Take 5  mLs by mouth every 12 (twelve) hours as needed. (Patient not taking: Reported on 11/29/2014) 140 mL 0  . levofloxacin (LEVAQUIN) 750 MG tablet Take 1 tablet (750 mg total) by mouth daily. (Patient not taking: Reported on 10/11/2015) 6 tablet 0   No facility-administered medications prior to visit.     Allergies:   Lisinopril   Social History   Social History  . Marital Status: Married    Spouse Name: N/A  . Number of Children: N/A  . Years of Education: N/A   Occupational History  . nurse [employer]   Social History Main Topics  . Smoking status: Never Smoker   . Smokeless tobacco: Never Used  . Alcohol Use: 0.0 oz/week    0 Standard drinks or equivalent per week     Comment: occasional  . Drug Use: No  . Sexual Activity: Not Asked   Other Topics Concern  . 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 There is no history of Heart attack.   ROS:   Please see the history of present illness.    Review of Systems  Cardiovascular: Positive for chest pain.   All other systems reviewed and are negative.   Physical Exam:    VS:  BP 126/80 mmHg  Pulse 70  Ht 6\' 4"  (1.93 m)  Wt 260 lb 3.2 oz (118.026 kg)  BMI 31.69 kg/m2   GEN: Well nourished, well developed, in no acute distress HEENT: normal Neck: no JVD, no masses Cardiac: Normal S1/S2, RRR; no murmurs, rubs, or gallops, no edema;  no carotid bruits,   Respiratory:  clear to auscultation bilaterally; no wheezing, rhonchi or rales GI: soft, nontender, nondistended MS: no deformity or atrophy Skin: warm and dry Neuro: No focal deficits  Psych: Alert and oriented x 3, normal affect  Wt Readings from Last 3 Encounters:  10/11/15 260 lb 3.2 oz (118.026 kg)  08/12/12  246 lb (111.585 kg)      Studies/Labs Reviewed:     EKG:  EKG is  ordered today.  The ekg ordered today demonstrates NSR, HR 69, normal axis, septal Q waves, NSSTTW changes, QTc 407 ms  Recent Labs: 10/05/2015: BUN 21*; Creatinine, Ser 1.27*; Hemoglobin 15.3; Platelets 200; Potassium 4.0; Sodium 142   Recent Lipid Panel No results found for: CHOL, TRIG, HDL, CHOLHDL, VLDL, LDLCALC, LDLDIRECT  Additional studies/ records that were reviewed today include:   Dg Chest 2 View  10/05/2015  CLINICAL DATA:  Left-sided chest and back pain for 1 week. Abnormal EKG. Hypercholesterolemia and hypertension. EXAM: CHEST  2 VIEW COMPARISON:  08/12/2012 FINDINGS: The heart size and mediastinal contours are within normal limits. Both lungs are clear. The visualized skeletal structures are unremarkable. IMPRESSION: Negative.  No active cardiopulmonary disease. Electronically Signed   By: Earle Gell M.D.   On: 10/05/2015 15:55      ASSESSMENT:     1. Chest pain, unspecified chest pain type   2. Essential hypertension   3. Hyperlipidemia     PLAN:     In order of problems listed above:  1. Chest pain - Atypical >> typical symptoms.  Etiologies include GERD, MSK pain, ischemia.  Suspect GERD given epigastric pain.  ECG with non-specific changes.  His CRFs include HTN, HL, gender.  Patient was also seen by Dr. Liam Rogers ([facility]) today.  -  Arrange plain ETT.  If neg, FU prn.  -  Continue PPI and FU with PCP.  2. HTN - Controlled.   3. HL - Continue statin.    Medication Adjustments/Labs and Tests Ordered: Current medicines are reviewed at length with the patient today.  Concerns regarding medicines are outlined above.  Medication changes, Labs and Tests ordered today are outlined in the Patient Instructions noted below. Patient Instructions  Medication Instructions:  Your physician recommends that you continue on your current medications as directed. Please refer to the Current Medication list  given to you today.  Labwork: NONE  Testing/Procedures: Your physician has requested that you have an exercise tolerance test. For further information please visit HugeFiesta.tn. Please also follow instruction sheet, as given.  Follow-Up: DR. Acie Fredrickson AS NEEDED  Any Other Special Instructions Will Be Listed Below (If Applicable).  If you need a refill on your cardiac medications before your next appointment, please call your pharmacy.   Signed, Cody Dopp, PA-C  10/11/2015 2:14  PM    [facility] [address], [place], [place]  [address] Phone: [phone]; Fax: [phone]    Attending Note:   The patient was seen and examined.  Agree with assessment and plan as noted above.  Changes made to the above note as needed.  Cody presents with some atypica CP. Was seen in the ER , troponin levels were negative. He has NS TWI in the inferior leads  Will get a GXT.    He will  see me on an as needed basis assuming the GXT is normal .    Thayer Headings, Brooke Bonito., MD, [facility] 10/11/2015, 5:37 PM [address]  [place] Pager [phone]

## 2015-10-12 NOTE — Progress Notes (Deleted)
      Lajean Saver, MD

## 2015-10-18 ENCOUNTER — Ambulatory Visit (INDEPENDENT_AMBULATORY_CARE_PROVIDER_SITE_OTHER): Payer: 59

## 2015-10-18 DIAGNOSIS — R079 Chest pain, unspecified: Secondary | ICD-10-CM | POA: Diagnosis not present

## 2015-10-18 LAB — EXERCISE TOLERANCE TEST
CHL CUP MPHR: 179 {beats}/min
CHL CUP STRESS STAGE 1 GRADE: 0 %
CHL CUP STRESS STAGE 1 SPEED: 0 mph
CHL CUP STRESS STAGE 10 GRADE: 0 %
CHL CUP STRESS STAGE 10 SPEED: 0 mph
CHL CUP STRESS STAGE 2 GRADE: 0 %
CHL CUP STRESS STAGE 2 SPEED: 0.4 mph
CHL CUP STRESS STAGE 3 HR: 87 {beats}/min
CHL CUP STRESS STAGE 3 SPEED: 0.9 mph
CHL CUP STRESS STAGE 4 DBP: 82 mmHg
CHL CUP STRESS STAGE 4 SPEED: 1.7 mph
CHL CUP STRESS STAGE 6 GRADE: 14 %
CHL CUP STRESS STAGE 6 SPEED: 3.4 mph
CHL CUP STRESS STAGE 7 HR: 173 {beats}/min
CHL CUP STRESS STAGE 7 SBP: 196 mmHg
CHL CUP STRESS STAGE 7 SPEED: 4.2 mph
CHL CUP STRESS STAGE 8 GRADE: 16 %
CHL CUP STRESS STAGE 9 HR: 141 {beats}/min
CSEPED: 12 min
CSEPEDS: 0 s
CSEPEW: 13.4 METS
CSEPPHR: 173 {beats}/min
CSEPPMHR: 96 %
Percent HR: 96 %
RPE: 17
Rest HR: 69 {beats}/min
Stage 1 DBP: 88 mmHg
Stage 1 HR: 73 {beats}/min
Stage 1 SBP: 133 mmHg
Stage 10 DBP: 85 mmHg
Stage 10 HR: 93 {beats}/min
Stage 10 SBP: 141 mmHg
Stage 2 HR: 88 {beats}/min
Stage 3 Grade: 0 %
Stage 4 Grade: 10 %
Stage 4 HR: 107 {beats}/min
Stage 4 SBP: 169 mmHg
Stage 5 DBP: 85 mmHg
Stage 5 Grade: 12 %
Stage 5 HR: 126 {beats}/min
Stage 5 SBP: 201 mmHg
Stage 5 Speed: 2.5 mph
Stage 6 DBP: 84 mmHg
Stage 6 HR: 153 {beats}/min
Stage 6 SBP: 190 mmHg
Stage 7 DBP: 94 mmHg
Stage 7 Grade: 16 %
Stage 8 HR: 173 {beats}/min
Stage 8 Speed: 4.2 mph
Stage 9 DBP: 86 mmHg
Stage 9 Grade: 0 %
Stage 9 SBP: 176 mmHg
Stage 9 Speed: 1.5 mph

## 2015-10-19 ENCOUNTER — Encounter: Payer: Self-pay | Admitting: Physician Assistant

## 2015-10-26 MED FILL — LOSARTAN POTASSIUM 25 MG TA: 25 | 90 days supply | Qty: 90 | Fill #0

## 2015-11-01 DIAGNOSIS — M545 Low back pain: Secondary | ICD-10-CM | POA: Diagnosis not present

## 2015-12-19 MED FILL — ATORVASTATIN 10 MG TABLET: 10 | 90 days supply | Qty: 90 | Fill #0

## 2016-02-02 MED FILL — LOSARTAN POTASSIUM 25 MG TA: 25 | 90 days supply | Qty: 90 | Fill #1

## 2016-02-08 DIAGNOSIS — B359 Dermatophytosis, unspecified: Secondary | ICD-10-CM | POA: Diagnosis not present

## 2016-02-08 MED FILL — BETAMETHASONE DP 0.05% OINT: 0.05 | 30 days supply | Qty: 45 | Fill #0

## 2016-02-08 MED FILL — KETOCONAZOLE 2% CREAM: 2 | 30 days supply | Qty: 60 | Fill #0

## 2016-03-08 MED FILL — KETOCONAZOLE 2% CREAM: 2 | 30 days supply | Qty: 60 | Fill #1

## 2016-03-27 MED FILL — ATORVASTATIN 10 MG TABLET: 10 | 30 days supply | Qty: 30 | Fill #0

## 2016-04-11 DIAGNOSIS — I10 Essential (primary) hypertension: Secondary | ICD-10-CM | POA: Diagnosis not present

## 2016-04-11 DIAGNOSIS — R21 Rash and other nonspecific skin eruption: Secondary | ICD-10-CM | POA: Diagnosis not present

## 2016-04-11 DIAGNOSIS — E782 Mixed hyperlipidemia: Secondary | ICD-10-CM | POA: Diagnosis not present

## 2016-04-18 DIAGNOSIS — I10 Essential (primary) hypertension: Secondary | ICD-10-CM | POA: Diagnosis not present

## 2016-04-18 DIAGNOSIS — L400 Psoriasis vulgaris: Secondary | ICD-10-CM | POA: Diagnosis not present

## 2016-04-18 DIAGNOSIS — E782 Mixed hyperlipidemia: Secondary | ICD-10-CM | POA: Diagnosis not present

## 2016-04-20 MED FILL — ATORVASTATIN 20 MG TABLET: 20 | 90 days supply | Qty: 90 | Fill #0

## 2016-05-07 MED FILL — LOSARTAN POTASSIUM 25 MG TA: 25 | 90 days supply | Qty: 90 | Fill #0

## 2016-06-12 DIAGNOSIS — M7662 Achilles tendinitis, left leg: Secondary | ICD-10-CM | POA: Diagnosis not present

## 2016-06-12 DIAGNOSIS — M6702 Short Achilles tendon (acquired), left ankle: Secondary | ICD-10-CM | POA: Diagnosis not present

## 2016-06-12 DIAGNOSIS — M7752 Other enthesopathy of left foot: Secondary | ICD-10-CM | POA: Diagnosis not present

## 2016-06-12 DIAGNOSIS — M79672 Pain in left foot: Secondary | ICD-10-CM | POA: Diagnosis not present

## 2016-07-30 MED FILL — ATORVASTATIN 20 MG TABLET: 20 | 90 days supply | Qty: 90 | Fill #1

## 2016-08-06 DIAGNOSIS — H5213 Myopia, bilateral: Secondary | ICD-10-CM | POA: Diagnosis not present

## 2016-08-09 MED FILL — LOSARTAN POTASSIUM 25 MG TA: 25 | 90 days supply | Qty: 90 | Fill #1

## 2016-11-01 MED FILL — ATORVASTATIN 20 MG TABLET: 20 | 30 days supply | Qty: 30 | Fill #0

## 2016-11-01 MED FILL — LOSARTAN POTASSIUM 25 MG TA: 25 | 30 days supply | Qty: 30 | Fill #0

## 2016-12-05 MED FILL — LOSARTAN POTASSIUM 25 MG TA: 25 | 30 days supply | Qty: 30 | Fill #1

## 2016-12-05 MED FILL — ATORVASTATIN 20 MG TABLET: 20 | 30 days supply | Qty: 30 | Fill #1

## 2017-01-10 MED FILL — ATORVASTATIN 20 MG TABLET: 20 | 30 days supply | Qty: 30 | Fill #0

## 2017-01-11 MED FILL — LOSARTAN POTASSIUM 25 MG TA: 25 | 30 days supply | Qty: 30 | Fill #0

## 2017-02-04 DIAGNOSIS — Z0000 Encounter for general adult medical examination without abnormal findings: Secondary | ICD-10-CM | POA: Diagnosis not present

## 2017-02-04 MED FILL — LOSARTAN POTASSIUM 50 MG TA: 50 | 90 days supply | Qty: 90 | Fill #0

## 2017-02-07 MED FILL — ATORVASTATIN 20 MG TABLET: 20 | 30 days supply | Qty: 30 | Fill #1

## 2017-03-18 DIAGNOSIS — M6281 Muscle weakness (generalized): Secondary | ICD-10-CM | POA: Diagnosis not present

## 2017-03-18 DIAGNOSIS — M545 Low back pain: Secondary | ICD-10-CM | POA: Diagnosis not present

## 2017-03-20 DIAGNOSIS — M6281 Muscle weakness (generalized): Secondary | ICD-10-CM | POA: Diagnosis not present

## 2017-03-20 DIAGNOSIS — M545 Low back pain: Secondary | ICD-10-CM | POA: Diagnosis not present

## 2017-03-27 DIAGNOSIS — M6281 Muscle weakness (generalized): Secondary | ICD-10-CM | POA: Diagnosis not present

## 2017-03-27 DIAGNOSIS — M545 Low back pain: Secondary | ICD-10-CM | POA: Diagnosis not present

## 2017-04-09 MED FILL — ATORVASTATIN 20 MG TABLET: 20 | 30 days supply | Qty: 30 | Fill #0

## 2017-04-17 IMAGING — CR DG CHEST 2V
2 series · 2 of 2 positions shown · non-contrast
Comparison: 08/12/2012

CLINICAL DATA: Left-sided chest and back pain for 1 week. Abnormal
EKG. Hypercholesterolemia and hypertension.

EXAM:
CHEST  2 VIEW

[chest pa]
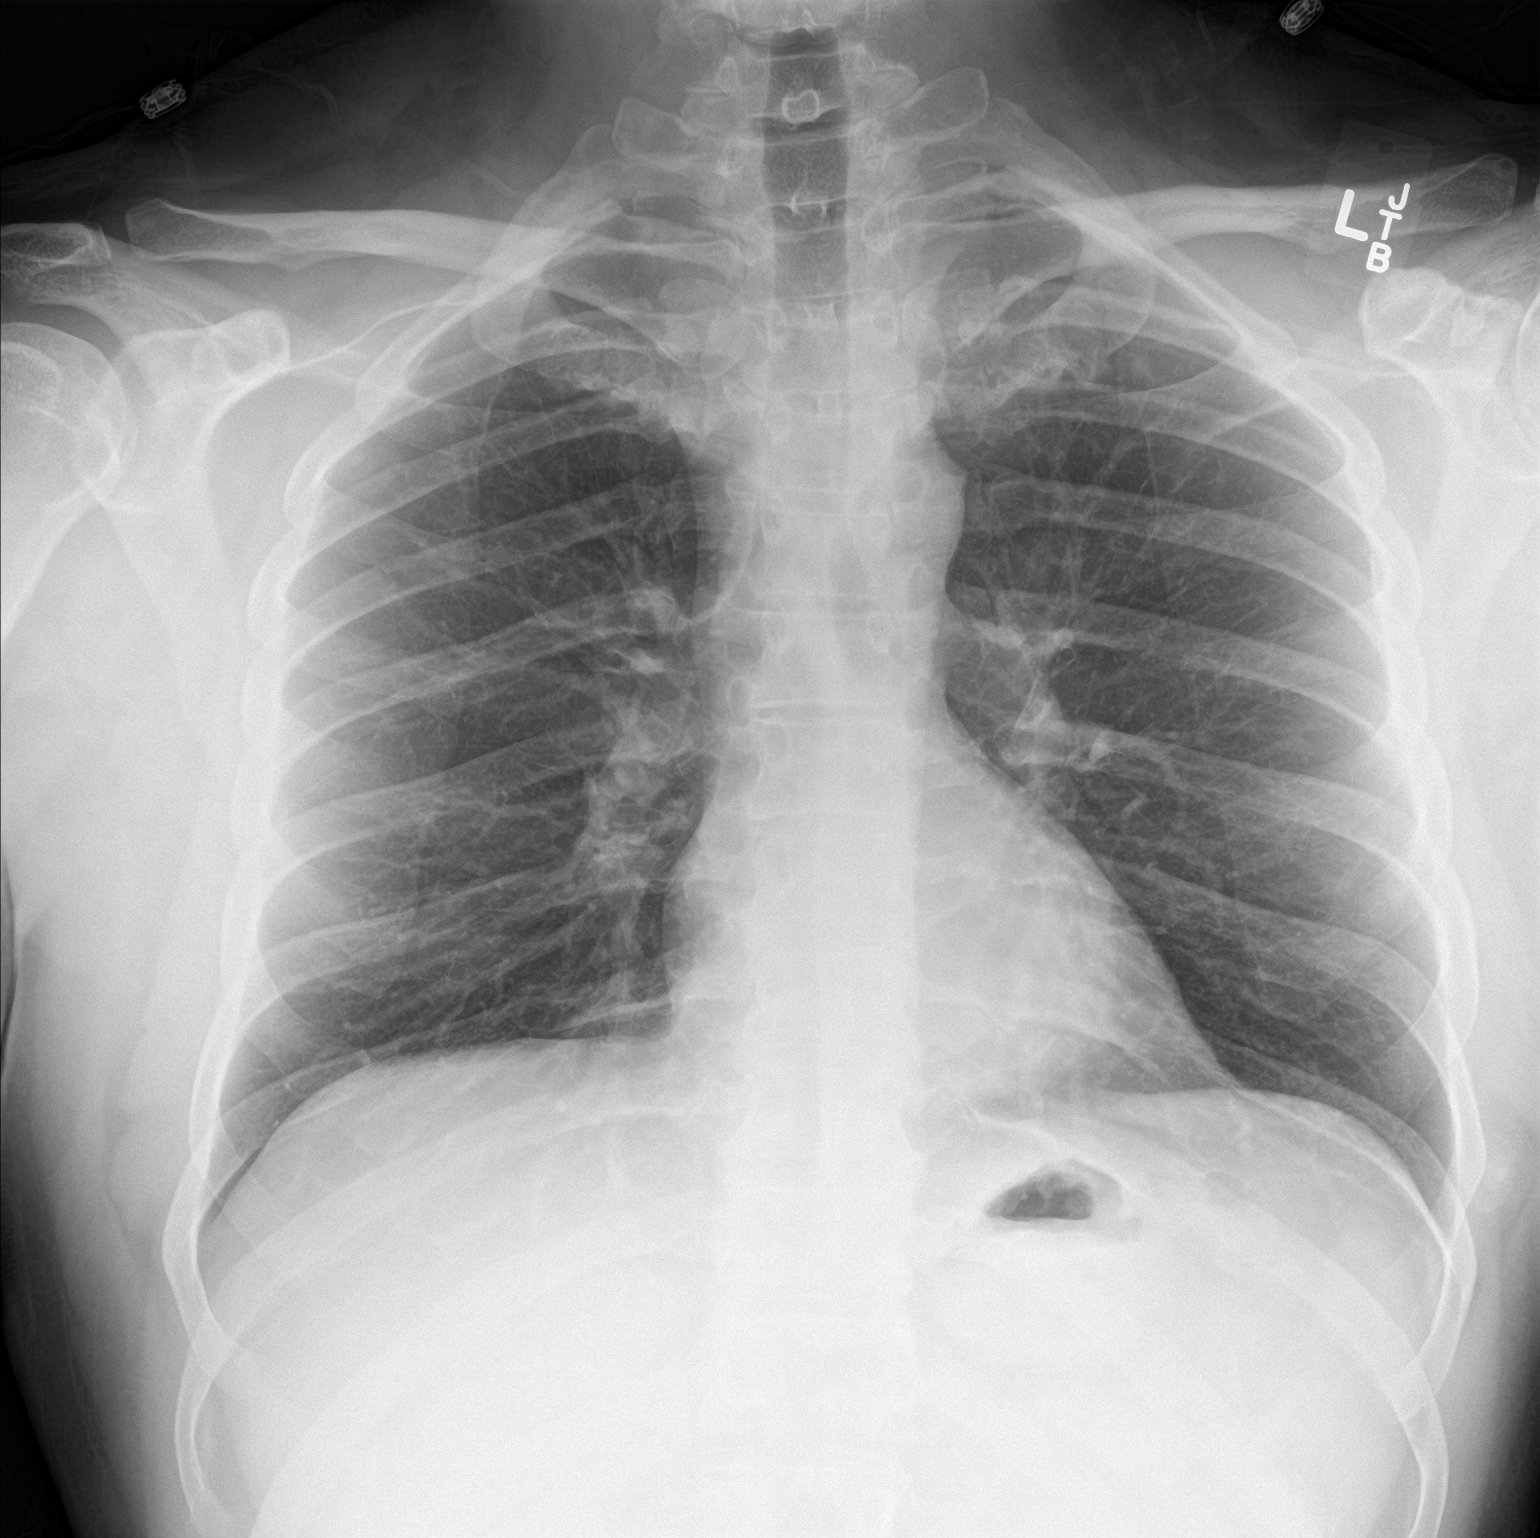

[chest lat]
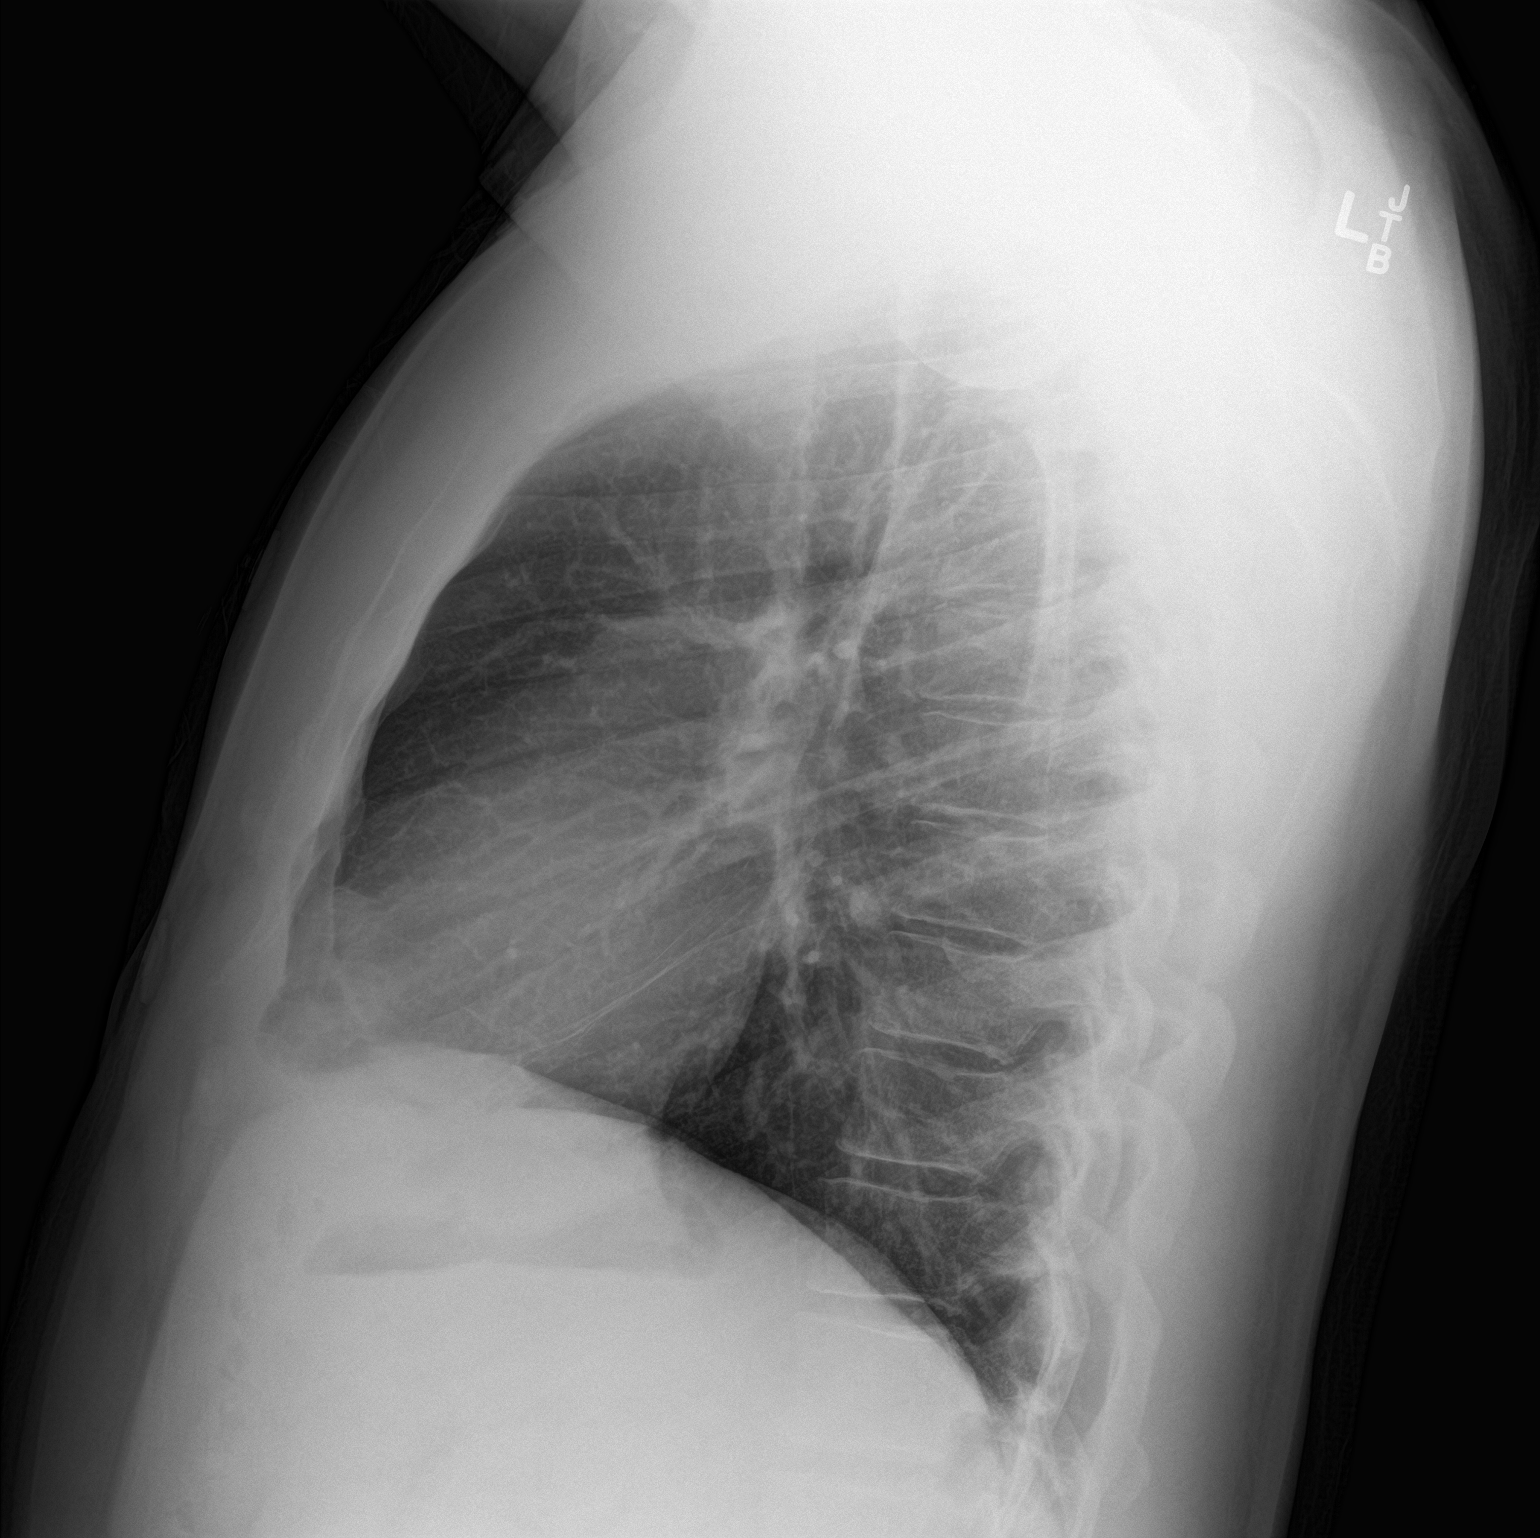

[2 of 2 positions shown; findings below may reference images not displayed]

FINDINGS: The heart size and mediastinal contours are within normal limits.
Both lungs are clear. The visualized skeletal structures are
unremarkable.
IMPRESSION: Negative.  No active cardiopulmonary disease.

## 2017-05-13 MED FILL — LOSARTAN POTASSIUM 50 MG TA: 50 | 90 days supply | Qty: 90 | Fill #1

## 2017-05-13 MED FILL — ATORVASTATIN 20 MG TABLET: 20 | 30 days supply | Qty: 30 | Fill #1

## 2017-06-06 DIAGNOSIS — M5416 Radiculopathy, lumbar region: Secondary | ICD-10-CM | POA: Diagnosis not present

## 2017-06-13 DIAGNOSIS — M818 Other osteoporosis without current pathological fracture: Secondary | ICD-10-CM | POA: Diagnosis not present

## 2017-06-13 DIAGNOSIS — M5116 Intervertebral disc disorders with radiculopathy, lumbar region: Secondary | ICD-10-CM | POA: Diagnosis not present

## 2017-06-13 DIAGNOSIS — R600 Localized edema: Secondary | ICD-10-CM | POA: Diagnosis not present

## 2017-06-13 DIAGNOSIS — M5117 Intervertebral disc disorders with radiculopathy, lumbosacral region: Secondary | ICD-10-CM | POA: Diagnosis not present

## 2017-06-13 DIAGNOSIS — M5416 Radiculopathy, lumbar region: Secondary | ICD-10-CM | POA: Diagnosis not present

## 2017-06-14 MED FILL — ATORVASTATIN 20 MG TABLET: 20 | 30 days supply | Qty: 30 | Fill #0

## 2017-06-28 DIAGNOSIS — M5136 Other intervertebral disc degeneration, lumbar region: Secondary | ICD-10-CM | POA: Diagnosis not present

## 2017-06-28 DIAGNOSIS — M47816 Spondylosis without myelopathy or radiculopathy, lumbar region: Secondary | ICD-10-CM | POA: Diagnosis not present

## 2017-06-28 DIAGNOSIS — M546 Pain in thoracic spine: Secondary | ICD-10-CM | POA: Diagnosis not present

## 2017-06-28 DIAGNOSIS — M549 Dorsalgia, unspecified: Secondary | ICD-10-CM | POA: Diagnosis not present

## 2017-06-28 DIAGNOSIS — I10 Essential (primary) hypertension: Secondary | ICD-10-CM | POA: Diagnosis not present

## 2017-06-28 DIAGNOSIS — M5416 Radiculopathy, lumbar region: Secondary | ICD-10-CM | POA: Diagnosis not present

## 2017-06-28 DIAGNOSIS — M5440 Lumbago with sciatica, unspecified side: Secondary | ICD-10-CM | POA: Diagnosis not present

## 2017-06-28 DIAGNOSIS — Z6831 Body mass index (BMI) 31.0-31.9, adult: Secondary | ICD-10-CM | POA: Diagnosis not present

## 2017-07-19 MED FILL — ATORVASTATIN 20 MG TABLET: 20 | 30 days supply | Qty: 30 | Fill #1

## 2017-07-23 DIAGNOSIS — M5416 Radiculopathy, lumbar region: Secondary | ICD-10-CM | POA: Diagnosis not present

## 2017-07-23 DIAGNOSIS — M5136 Other intervertebral disc degeneration, lumbar region: Secondary | ICD-10-CM | POA: Diagnosis not present

## 2017-07-23 DIAGNOSIS — I10 Essential (primary) hypertension: Secondary | ICD-10-CM | POA: Diagnosis not present

## 2017-07-23 DIAGNOSIS — Z6830 Body mass index (BMI) 30.0-30.9, adult: Secondary | ICD-10-CM | POA: Diagnosis not present

## 2017-07-23 DIAGNOSIS — M5126 Other intervertebral disc displacement, lumbar region: Secondary | ICD-10-CM | POA: Diagnosis not present

## 2017-07-23 DIAGNOSIS — M47816 Spondylosis without myelopathy or radiculopathy, lumbar region: Secondary | ICD-10-CM | POA: Diagnosis not present

## 2017-08-22 MED FILL — LOSARTAN POTASSIUM 50 MG TA: 50 | 90 days supply | Qty: 90 | Fill #2

## 2017-08-23 MED FILL — ATORVASTATIN 20 MG TABLET: 20 | 30 days supply | Qty: 30 | Fill #0

## 2017-08-26 DIAGNOSIS — H5213 Myopia, bilateral: Secondary | ICD-10-CM | POA: Diagnosis not present

## 2017-09-05 MED FILL — predniSONE 10 MG TABS: 10 | 6 days supply | Qty: 21 | Fill #0

## 2017-09-26 DIAGNOSIS — I10 Essential (primary) hypertension: Secondary | ICD-10-CM | POA: Diagnosis not present

## 2017-09-26 DIAGNOSIS — E782 Mixed hyperlipidemia: Secondary | ICD-10-CM | POA: Diagnosis not present

## 2017-09-30 DIAGNOSIS — E782 Mixed hyperlipidemia: Secondary | ICD-10-CM | POA: Diagnosis not present

## 2017-10-01 MED FILL — ATORVASTATIN 40 MG TABLET: 40 | 90 days supply | Qty: 90 | Fill #0

## 2017-10-28 MED FILL — LOSARTAN POTASSIUM 100 MG T: 100 | 90 days supply | Qty: 90 | Fill #0

## 2018-01-02 MED FILL — ATORVASTATIN 40 MG TABLET: 40 | 90 days supply | Qty: 90 | Fill #1

## 2018-01-23 MED FILL — LOSARTAN POTASSIUM 100 MG T: 100 | 90 days supply | Qty: 90 | Fill #1

## 2018-04-07 MED FILL — ATORVASTATIN 40 MG TABLET: 40 | 90 days supply | Qty: 90 | Fill #2

## 2018-04-28 MED FILL — LOSARTAN POTASSIUM 100 MG T: 100 | 90 days supply | Qty: 90 | Fill #2

## 2018-05-22 DIAGNOSIS — R6889 Other general symptoms and signs: Secondary | ICD-10-CM | POA: Diagnosis not present

## 2018-05-22 DIAGNOSIS — J22 Unspecified acute lower respiratory infection: Secondary | ICD-10-CM | POA: Diagnosis not present

## 2018-05-22 DIAGNOSIS — R21 Rash and other nonspecific skin eruption: Secondary | ICD-10-CM | POA: Diagnosis not present

## 2018-07-11 MED FILL — ATORVASTATIN 40 MG TABLET: 40 | 90 days supply | Qty: 90 | Fill #3

## 2018-07-30 MED FILL — LOSARTAN POTASSIUM 100 MG T: 100 | 90 days supply | Qty: 90 | Fill #3

## 2018-10-06 MED FILL — ATORVASTATIN 40 MG TABLET: 40 | 90 days supply | Qty: 90 | Fill #0

## 2018-10-07 MED FILL — LOSARTAN POTASSIUM 100 MG T: 100 | 90 days supply | Qty: 90 | Fill #0

## 2019-01-14 MED FILL — ATORVASTATIN 40 MG TABLET: 40 | 90 days supply | Qty: 90 | Fill #0

## 2019-01-14 MED FILL — LOSARTAN POTASSIUM 100 MG T: 100 | 90 days supply | Qty: 90 | Fill #0

## 2019-01-28 DIAGNOSIS — I10 Essential (primary) hypertension: Secondary | ICD-10-CM | POA: Diagnosis not present

## 2019-01-28 DIAGNOSIS — E782 Mixed hyperlipidemia: Secondary | ICD-10-CM | POA: Diagnosis not present

## 2019-01-29 DIAGNOSIS — K648 Other hemorrhoids: Secondary | ICD-10-CM | POA: Diagnosis not present

## 2019-01-29 DIAGNOSIS — Z800 Family history of malignant neoplasm of digestive organs: Secondary | ICD-10-CM | POA: Diagnosis not present

## 2019-01-29 DIAGNOSIS — D123 Benign neoplasm of transverse colon: Secondary | ICD-10-CM | POA: Diagnosis not present

## 2019-01-29 DIAGNOSIS — Z1211 Encounter for screening for malignant neoplasm of colon: Secondary | ICD-10-CM | POA: Diagnosis not present

## 2019-02-09 MED FILL — valACYclovir HCL 1 GM TABS: 1 | 1 days supply | Qty: 4 | Fill #0

## 2019-04-20 MED FILL — LOSARTAN POTASSIUM 100 MG T: 100 | 90 days supply | Qty: 90 | Fill #0

## 2019-04-20 MED FILL — ATORVASTATIN 40 MG TABLET: 40 | 90 days supply | Qty: 90 | Fill #0

## 2019-07-20 MED FILL — ATORVASTATIN 40 MG TABLET: 40 | 90 days supply | Qty: 90 | Fill #1

## 2019-07-20 MED FILL — LOSARTAN POTASSIUM 100 MG T: 100 | 90 days supply | Qty: 90 | Fill #1

## 2019-08-03 ENCOUNTER — Other Ambulatory Visit (HOSPITAL_COMMUNITY): Payer: Self-pay | Admitting: Family Medicine

## 2019-08-03 DIAGNOSIS — Z125 Encounter for screening for malignant neoplasm of prostate: Secondary | ICD-10-CM | POA: Diagnosis not present

## 2019-08-03 DIAGNOSIS — I10 Essential (primary) hypertension: Secondary | ICD-10-CM | POA: Diagnosis not present

## 2019-08-03 DIAGNOSIS — E782 Mixed hyperlipidemia: Secondary | ICD-10-CM | POA: Diagnosis not present

## 2019-08-03 DIAGNOSIS — Z0000 Encounter for general adult medical examination without abnormal findings: Secondary | ICD-10-CM | POA: Diagnosis not present

## 2019-10-23 MED FILL — ATORVASTATIN 40 MG TABLET: 40 | 90 days supply | Qty: 90 | Fill #0

## 2019-10-23 MED FILL — LOSARTAN POTASSIUM 100 MG T: 100 | 90 days supply | Qty: 90 | Fill #0

## 2019-10-29 DIAGNOSIS — Z23 Encounter for immunization: Secondary | ICD-10-CM | POA: Diagnosis not present

## 2020-01-21 ENCOUNTER — Ambulatory Visit: Payer: Self-pay

## 2020-01-21 ENCOUNTER — Ambulatory Visit (INDEPENDENT_AMBULATORY_CARE_PROVIDER_SITE_OTHER): Payer: 59

## 2020-01-21 ENCOUNTER — Ambulatory Visit: Payer: 59 | Admitting: Orthopaedic Surgery

## 2020-01-21 ENCOUNTER — Encounter: Payer: Self-pay | Admitting: Orthopaedic Surgery

## 2020-01-21 VITALS — Ht 75.75 in | Wt 253.0 lb

## 2020-01-21 DIAGNOSIS — M19012 Primary osteoarthritis, left shoulder: Secondary | ICD-10-CM | POA: Diagnosis not present

## 2020-01-21 DIAGNOSIS — M25512 Pain in left shoulder: Secondary | ICD-10-CM

## 2020-01-21 DIAGNOSIS — G8929 Other chronic pain: Secondary | ICD-10-CM

## 2020-01-21 DIAGNOSIS — M19011 Primary osteoarthritis, right shoulder: Secondary | ICD-10-CM | POA: Diagnosis not present

## 2020-01-21 DIAGNOSIS — M25511 Pain in right shoulder: Secondary | ICD-10-CM | POA: Diagnosis not present

## 2020-01-21 NOTE — Progress Notes (Signed)
Office Visit Note   Patient: Cody Graham           Date of Birth: Aug 28, 1975           MRN: [id number] Visit Date: 01/21/2020              Requested by: Hardie Lora, MD [address] [place],  [place] [address] PCP: Hardie Lora, MD   Assessment & Plan: Visit Diagnoses:  1. Chronic pain of both shoulders     Plan: My impression is bilateral shoulder osteoarthritis with moderate limitation range of motion.  I think he may have a touch of biceps tenosynovitis on the right side as well.  He would like to try cortisone injections in both shoulders today with possible injection of the biceps tendon sheath per Dr. Junius Roads discretion.  We will see him back as needed.  Follow-Up Instructions: Return if symptoms worsen or fail to improve.   Orders:  Orders Placed This Encounter  Procedures  . XR Shoulder Left  . XR Shoulder Right  . US Guided Needle Placement - No Linked Charges   No orders of the defined types were placed in this encounter.     Procedures: No procedures performed   Clinical Data: No additional findings.   Subjective: Chief Complaint  Patient presents with  . Right Shoulder - Pain  . Left Shoulder - Pain    Cody is a 44 year old gentleman who works as a Health visitor at [employer] who I saw several years ago for right shoulder pain comes in with recent exacerbation of bilateral shoulder pain.  He has been doing a lot of swimming which helps.  He denies any weakness.  He has had trouble with throwing baseball overhead.  He had a prior MRI in 2016 in the right shoulder which showed glenohumeral arthritis and biceps tenosynovitis.  Denies any numbness or tingling or radicular symptoms.   Review of Systems  Constitutional: Negative.   All other systems reviewed and are negative.    Objective: Vital Signs: Ht 6' 3.75" (1.924 m)   Wt 253 lb (114.8 kg)   BMI 31.00 kg/m   Physical Exam Vitals and nursing note reviewed.  Constitutional:       Appearance: He is well-developed.  HENT:     Head: Normocephalic and atraumatic.  Eyes:     Pupils: Pupils are equal, round, and reactive to light.  Pulmonary:     Effort: Pulmonary effort is normal.  Abdominal:     Palpations: Abdomen is soft.  Musculoskeletal:        General: Normal range of motion.     Cervical back: Neck supple.  Skin:    General: Skin is warm.  Neurological:     Mental Status: He is alert and oriented to person, place, and time.  Psychiatric:        Behavior: Behavior normal.        Thought Content: Thought content normal.        Judgment: Judgment normal.     Ortho Exam Bilateral shoulders show moderate limitation in range of motion with forward flexion and external rotation internal rotation and abduction.  Manual muscle testing is normal.  Negative impingement. Specialty Comments:  No specialty comments available.  Imaging: XR Shoulder Left  Result Date: 01/21/2020 Moderate glenohumeral arthritis.  XR Shoulder Right  Result Date: 01/21/2020 Moderate glenohumeral arthritis    PMFS History: Patient Active Problem List   Diagnosis Date Noted  .  Hyperlipidemia 10/11/2015  . Healthcare-associated pneumonia 08/13/2012  . Hypertension 08/12/2012   Past Medical History:  Diagnosis Date  . DJD (degenerative joint disease)    shoulder  . History of exercise stress test    ETT 4/17:  Good ex tol; Exercised x 12"; No ST changes  . HLD (hyperlipidemia)   . Hypertension 2015   HCTZ dc'd 2/2 gout  . Pneumonia 08/11/2012    Family History  Problem Relation Age of Onset  . Colon cancer Father 42       died at 18  . Heart attack Neg Hx     Past Surgical History:  Procedure Laterality Date  . APPENDECTOMY    . COLONOSCOPY  2012   Social History   Occupational History  . Occupation: Optician, dispensing: [employer]  Tobacco Use  . Smoking status: Never Smoker  . Smokeless tobacco: Never Used  Substance and Sexual Activity  . Alcohol use:  Yes    Alcohol/week: 0.0 standard drinks    Comment: occasional  . Drug use: No  . Sexual activity: Not on file

## 2020-01-21 NOTE — Progress Notes (Signed)
Subjective: Patient is here for ultrasound-guided intra-articular bilateral glenohumeral injection.  He also has tenderness near the right long head biceps tendon.  Objective: Decreased overhead reach bilaterally.  Procedure: Ultrasound-guided bilateral glenohumeral injection: After sterile prep with Betadine, injected 8 cc 1% lidocaine without epinephrine and 40 mg methylprednisolone using a 22-gauge spinal needle, passing the needle from posterior approach into the glenohumeral joint.  Injectate was seen filling both joint capsules.  He had good relief in both shoulders.  W solve his problem, he will come back in for a biceps injection.  E therefore decided to not inject his right biceps tendon.  If this does not

## 2020-01-27 MED FILL — ATORVASTATIN 40 MG TABLET: 40 | 90 days supply | Qty: 90 | Fill #1

## 2020-01-27 MED FILL — LOSARTAN POTASSIUM 100 MG T: 100 | 90 days supply | Qty: 90 | Fill #1

## 2020-02-08 DIAGNOSIS — E782 Mixed hyperlipidemia: Secondary | ICD-10-CM | POA: Diagnosis not present

## 2020-02-08 DIAGNOSIS — I10 Essential (primary) hypertension: Secondary | ICD-10-CM | POA: Diagnosis not present

## 2020-04-27 MED FILL — ATORVASTATIN 40 MG TABLET: 40 | 90 days supply | Qty: 90 | Fill #2

## 2020-04-27 MED FILL — LOSARTAN POTASSIUM 100 MG T: 100 | 90 days supply | Qty: 90 | Fill #2

## 2020-07-26 MED FILL — ATORVASTATIN 40 MG TABLET: 40 | 90 days supply | Qty: 90 | Fill #3

## 2020-07-26 MED FILL — LOSARTAN POTASSIUM 100 MG T: 100 | 30 days supply | Qty: 30 | Fill #3

## 2020-08-11 DIAGNOSIS — R059 Cough, unspecified: Secondary | ICD-10-CM | POA: Diagnosis not present

## 2020-08-11 DIAGNOSIS — R52 Pain, unspecified: Secondary | ICD-10-CM | POA: Diagnosis not present

## 2020-08-11 DIAGNOSIS — J22 Unspecified acute lower respiratory infection: Secondary | ICD-10-CM | POA: Diagnosis not present

## 2020-08-11 DIAGNOSIS — R509 Fever, unspecified: Secondary | ICD-10-CM | POA: Diagnosis not present

## 2020-08-11 DIAGNOSIS — J029 Acute pharyngitis, unspecified: Secondary | ICD-10-CM | POA: Diagnosis not present

## 2020-08-23 DIAGNOSIS — Z0000 Encounter for general adult medical examination without abnormal findings: Secondary | ICD-10-CM | POA: Diagnosis not present

## 2020-08-23 DIAGNOSIS — I10 Essential (primary) hypertension: Secondary | ICD-10-CM | POA: Diagnosis not present

## 2020-08-23 DIAGNOSIS — E782 Mixed hyperlipidemia: Secondary | ICD-10-CM | POA: Diagnosis not present

## 2020-08-26 ENCOUNTER — Other Ambulatory Visit (HOSPITAL_COMMUNITY): Payer: Self-pay | Admitting: Family Medicine

## 2020-08-26 MED FILL — LOSARTAN POTASSIUM 100 MG T: 100 | 30 days supply | Qty: 30 | Fill #0

## 2020-09-02 DIAGNOSIS — Z0000 Encounter for general adult medical examination without abnormal findings: Secondary | ICD-10-CM | POA: Diagnosis not present

## 2020-10-04 MED FILL — LOSARTAN POTASSIUM 100 MG T: 100 | 30 days supply | Qty: 30 | Fill #1

## 2020-10-27 ENCOUNTER — Other Ambulatory Visit (HOSPITAL_COMMUNITY): Payer: Self-pay

## 2020-10-27 MED FILL — Losartan Potassium Tab 100 MG: ORAL | 90 days supply | Qty: 90 | Fill #0 | Status: AC

## 2020-10-28 ENCOUNTER — Other Ambulatory Visit (HOSPITAL_COMMUNITY): Payer: Self-pay

## 2020-10-31 ENCOUNTER — Other Ambulatory Visit (HOSPITAL_COMMUNITY): Payer: Self-pay

## 2020-10-31 MED ORDER — ATORVASTATIN CALCIUM 40 MG PO TABS
40.0000 mg | ORAL_TABLET | Freq: Every day | ORAL | 1 refills | Status: AC
  Filled 2020-10-31: qty 90, 90d supply, fill #0
  Filled 2021-01-23: qty 90, 90d supply, fill #1

## 2021-01-23 ENCOUNTER — Other Ambulatory Visit (HOSPITAL_COMMUNITY): Payer: Self-pay

## 2021-01-23 MED FILL — Losartan Potassium Tab 100 MG: ORAL | 90 days supply | Qty: 90 | Fill #1 | Status: AC

## 2021-04-26 ENCOUNTER — Other Ambulatory Visit (HOSPITAL_COMMUNITY): Payer: Self-pay

## 2021-04-26 MED FILL — Losartan Potassium Tab 100 MG: ORAL | 90 days supply | Qty: 90 | Fill #2 | Status: AC

## 2021-04-27 ENCOUNTER — Other Ambulatory Visit (HOSPITAL_COMMUNITY): Payer: Self-pay

## 2021-04-27 MED ORDER — ATORVASTATIN CALCIUM 40 MG PO TABS
40.0000 mg | ORAL_TABLET | Freq: Every day | ORAL | 0 refills | Status: DC
  Filled 2021-04-27: qty 90, 90d supply, fill #0

## 2021-07-28 ENCOUNTER — Ambulatory Visit (INDEPENDENT_AMBULATORY_CARE_PROVIDER_SITE_OTHER): Payer: 59

## 2021-07-28 ENCOUNTER — Encounter: Payer: Self-pay | Admitting: Podiatry

## 2021-07-28 ENCOUNTER — Other Ambulatory Visit (HOSPITAL_COMMUNITY): Payer: Self-pay

## 2021-07-28 ENCOUNTER — Telehealth: Payer: Self-pay | Admitting: *Deleted

## 2021-07-28 ENCOUNTER — Other Ambulatory Visit: Payer: Self-pay

## 2021-07-28 ENCOUNTER — Ambulatory Visit: Payer: 59 | Admitting: Podiatry

## 2021-07-28 DIAGNOSIS — M79674 Pain in right toe(s): Secondary | ICD-10-CM

## 2021-07-28 DIAGNOSIS — M109 Gout, unspecified: Secondary | ICD-10-CM | POA: Diagnosis not present

## 2021-07-28 DIAGNOSIS — M7989 Other specified soft tissue disorders: Secondary | ICD-10-CM | POA: Diagnosis not present

## 2021-07-28 MED ORDER — ATORVASTATIN CALCIUM 40 MG PO TABS
40.0000 mg | ORAL_TABLET | Freq: Every day | ORAL | 0 refills | Status: DC
  Filled 2021-07-28: qty 90, 90d supply, fill #0

## 2021-07-28 MED ORDER — COLCHICINE 0.6 MG PO TABS: 0.6000 mg | ORAL_TABLET | Freq: Every day | ORAL | 0 refills | Status: AC

## 2021-07-28 MED ORDER — LOSARTAN POTASSIUM 100 MG PO TABS
100.0000 mg | ORAL_TABLET | Freq: Every day | ORAL | 0 refills | Status: DC
  Filled 2021-07-28: qty 90, 90d supply, fill #0

## 2021-07-28 MED ORDER — DEXAMETHASONE SODIUM PHOSPHATE 120 MG/30ML IJ SOLN: 4.0000 mg | Freq: Once | INTRAMUSCULAR | Status: AC

## 2021-07-28 NOTE — Telephone Encounter (Signed)
[facility] pharmacy calling for a verbal on the colchicine-0.6 mg,prescription not signed.  Returned the call back to pharmacy to give verbal according to epic note but (Chris)said that the patient decided to take prescription with him.

## 2021-07-28 NOTE — Progress Notes (Signed)
°  Subjective:  Patient ID: Cody Graham, male    DOB: September 02, 1975,   MRN: [id number]  No chief complaint on file.   46 y.o. male presents for concern of pain in his right foot that believes could be gout. Relates he was playing with his daughter at the beginning of the month and relates after that started to have redness swelling and pain in the joint. Has had a suspected flare in the past and relates he tried allopurinol. Has also had a steroid pack recently that finished up on Saturday which did help somewhat but continued to have pain.   . Denies any other pedal complaints. Denies n/v/f/c.   Past Medical History:  Diagnosis Date   DJD (degenerative joint disease)    shoulder   History of exercise stress test    ETT 4/17:  Good ex tol; Exercised x 12"; No ST changes   HLD (hyperlipidemia)    Hypertension 2015   HCTZ dc'd 2/2 gout   Pneumonia 08/11/2012    Objective:  Physical Exam: Vascular: DP/PT pulses 2/4 bilateral. CFT <3 seconds. Normal hair growth on digits. No edema.  Skin. No lacerations or abrasions bilateral feet. Erythema and edema noted to first MPJ on right.  Musculoskeletal: MMT 5/5 bilateral lower extremities in DF, PF, Inversion and Eversion. Deceased ROM in DF of ankle joint. Tender to first MPJ pain with ROM of the first MPJ on the right.  Neurological: Sensation intact to light touch.   Assessment:   1. Gout of right foot, unspecified cause, unspecified chronicity      Plan:  Patient was evaluated and treated and all questions answered. -Xrays reviewed -Discussed treatement options for gouty arthritis and gout education provided. -Patient opted for injection. After oral consent, injected right/left <location> with 1cc lidocaine and marcaine plain mixed with 0.25cc Kenalog-10 and Dexmethasone phosphate without complication; post injection care explained. -Discussed diet and modifications.  -Rx Colchicine 0.6mg  -Advised patient to call if symptoms are not  improved within 1 week -Patient to return in 3 weeks for re-check/further discussion for long term management of gout or sooner if condition worsens.   Lorenda Peck, DPM

## 2021-08-03 DIAGNOSIS — R55 Syncope and collapse: Secondary | ICD-10-CM | POA: Diagnosis not present

## 2021-08-03 DIAGNOSIS — R002 Palpitations: Secondary | ICD-10-CM | POA: Diagnosis not present

## 2021-08-03 DIAGNOSIS — R42 Dizziness and giddiness: Secondary | ICD-10-CM | POA: Diagnosis not present

## 2021-08-03 DIAGNOSIS — I493 Ventricular premature depolarization: Secondary | ICD-10-CM | POA: Diagnosis not present

## 2021-08-14 DIAGNOSIS — R002 Palpitations: Secondary | ICD-10-CM | POA: Diagnosis not present

## 2021-08-14 DIAGNOSIS — R55 Syncope and collapse: Secondary | ICD-10-CM | POA: Diagnosis not present

## 2021-08-14 DIAGNOSIS — I493 Ventricular premature depolarization: Secondary | ICD-10-CM | POA: Diagnosis not present

## 2021-08-14 DIAGNOSIS — R42 Dizziness and giddiness: Secondary | ICD-10-CM | POA: Diagnosis not present

## 2021-08-20 DIAGNOSIS — R509 Fever, unspecified: Secondary | ICD-10-CM | POA: Diagnosis not present

## 2021-08-20 DIAGNOSIS — R059 Cough, unspecified: Secondary | ICD-10-CM | POA: Diagnosis not present

## 2021-08-20 DIAGNOSIS — R0981 Nasal congestion: Secondary | ICD-10-CM | POA: Diagnosis not present

## 2021-08-20 DIAGNOSIS — J09X2 Influenza due to identified novel influenza A virus with other respiratory manifestations: Secondary | ICD-10-CM | POA: Diagnosis not present

## 2021-08-20 DIAGNOSIS — M791 Myalgia, unspecified site: Secondary | ICD-10-CM | POA: Diagnosis not present

## 2021-08-24 DIAGNOSIS — R42 Dizziness and giddiness: Secondary | ICD-10-CM | POA: Diagnosis not present

## 2021-08-24 DIAGNOSIS — R002 Palpitations: Secondary | ICD-10-CM | POA: Diagnosis not present

## 2021-08-24 DIAGNOSIS — R55 Syncope and collapse: Secondary | ICD-10-CM | POA: Diagnosis not present

## 2021-08-24 DIAGNOSIS — I493 Ventricular premature depolarization: Secondary | ICD-10-CM | POA: Diagnosis not present

## 2021-08-29 DIAGNOSIS — E782 Mixed hyperlipidemia: Secondary | ICD-10-CM | POA: Diagnosis not present

## 2021-08-29 DIAGNOSIS — Z0000 Encounter for general adult medical examination without abnormal findings: Secondary | ICD-10-CM | POA: Diagnosis not present

## 2021-08-29 DIAGNOSIS — I10 Essential (primary) hypertension: Secondary | ICD-10-CM | POA: Diagnosis not present

## 2021-08-29 DIAGNOSIS — Z125 Encounter for screening for malignant neoplasm of prostate: Secondary | ICD-10-CM | POA: Diagnosis not present

## 2021-09-12 DIAGNOSIS — G4719 Other hypersomnia: Secondary | ICD-10-CM | POA: Diagnosis not present

## 2021-09-12 DIAGNOSIS — I10 Essential (primary) hypertension: Secondary | ICD-10-CM | POA: Diagnosis not present

## 2021-10-02 ENCOUNTER — Other Ambulatory Visit (HOSPITAL_COMMUNITY): Payer: Self-pay

## 2021-10-02 MED ORDER — ROSUVASTATIN CALCIUM 5 MG PO TABS
5.0000 mg | ORAL_TABLET | Freq: Every day | ORAL | 5 refills | Status: AC
  Filled 2021-10-02: qty 30, 30d supply, fill #0
  Filled 2021-11-07: qty 30, 30d supply, fill #1
  Filled 2022-01-08 – 2022-01-22 (×2): qty 30, 30d supply, fill #2
  Filled 2022-03-18: qty 30, 30d supply, fill #3
  Filled 2022-05-02: qty 30, 30d supply, fill #4

## 2021-10-06 DIAGNOSIS — I10 Essential (primary) hypertension: Secondary | ICD-10-CM | POA: Diagnosis not present

## 2021-10-06 DIAGNOSIS — G4710 Hypersomnia, unspecified: Secondary | ICD-10-CM | POA: Diagnosis not present

## 2021-11-01 ENCOUNTER — Other Ambulatory Visit (HOSPITAL_COMMUNITY): Payer: Self-pay

## 2021-11-01 MED ORDER — LOSARTAN POTASSIUM 100 MG PO TABS
100.0000 mg | ORAL_TABLET | Freq: Every day | ORAL | 0 refills | Status: DC
  Filled 2021-11-01: qty 90, 90d supply, fill #0

## 2021-11-07 ENCOUNTER — Other Ambulatory Visit (HOSPITAL_COMMUNITY): Payer: Self-pay

## 2022-01-08 ENCOUNTER — Other Ambulatory Visit (HOSPITAL_COMMUNITY): Payer: Self-pay

## 2022-01-11 ENCOUNTER — Other Ambulatory Visit (HOSPITAL_COMMUNITY): Payer: Self-pay

## 2022-01-11 MED ORDER — LOSARTAN POTASSIUM 100 MG PO TABS
100.0000 mg | ORAL_TABLET | Freq: Every day | ORAL | 0 refills | Status: DC
  Filled 2022-01-11 – 2022-01-22 (×2): qty 90, 90d supply, fill #0

## 2022-01-16 ENCOUNTER — Other Ambulatory Visit (HOSPITAL_COMMUNITY): Payer: Self-pay

## 2022-01-22 ENCOUNTER — Other Ambulatory Visit (HOSPITAL_COMMUNITY): Payer: Self-pay

## 2022-03-19 ENCOUNTER — Other Ambulatory Visit (HOSPITAL_COMMUNITY): Payer: Self-pay

## 2022-03-30 ENCOUNTER — Other Ambulatory Visit (HOSPITAL_COMMUNITY): Payer: Self-pay

## 2022-05-02 ENCOUNTER — Other Ambulatory Visit (HOSPITAL_COMMUNITY): Payer: Self-pay

## 2022-05-03 ENCOUNTER — Other Ambulatory Visit (HOSPITAL_COMMUNITY): Payer: Self-pay

## 2022-05-03 MED ORDER — LOSARTAN POTASSIUM 100 MG PO TABS
100.0000 mg | ORAL_TABLET | Freq: Every day | ORAL | 1 refills | Status: DC
  Filled 2022-05-03: qty 90, 90d supply, fill #0
  Filled 2022-08-09: qty 90, 90d supply, fill #1

## 2022-06-12 DIAGNOSIS — M9901 Segmental and somatic dysfunction of cervical region: Secondary | ICD-10-CM | POA: Diagnosis not present

## 2022-06-12 DIAGNOSIS — M9903 Segmental and somatic dysfunction of lumbar region: Secondary | ICD-10-CM | POA: Diagnosis not present

## 2022-06-12 DIAGNOSIS — M9902 Segmental and somatic dysfunction of thoracic region: Secondary | ICD-10-CM | POA: Diagnosis not present

## 2022-06-12 DIAGNOSIS — M9904 Segmental and somatic dysfunction of sacral region: Secondary | ICD-10-CM | POA: Diagnosis not present

## 2022-09-19 ENCOUNTER — Other Ambulatory Visit (HOSPITAL_COMMUNITY): Payer: Self-pay

## 2022-09-19 MED ORDER — ROSUVASTATIN CALCIUM 10 MG PO TABS
10.0000 mg | ORAL_TABLET | Freq: Every day | ORAL | 1 refills | Status: DC
  Filled 2022-09-19: qty 90, 90d supply, fill #0
  Filled 2022-12-17 (×2): qty 90, 90d supply, fill #1

## 2022-11-15 ENCOUNTER — Other Ambulatory Visit (HOSPITAL_COMMUNITY): Payer: Self-pay

## 2022-11-15 MED ORDER — LOSARTAN POTASSIUM 100 MG PO TABS
100.0000 mg | ORAL_TABLET | Freq: Every day | ORAL | 1 refills | Status: DC
  Filled 2022-11-15 – 2022-11-19 (×2): qty 90, 90d supply, fill #0
  Filled 2023-02-03: qty 90, 90d supply, fill #1

## 2022-11-19 ENCOUNTER — Other Ambulatory Visit (HOSPITAL_COMMUNITY): Payer: Self-pay

## 2022-11-19 ENCOUNTER — Other Ambulatory Visit: Payer: Self-pay

## 2022-12-11 DIAGNOSIS — M545 Low back pain, unspecified: Secondary | ICD-10-CM | POA: Diagnosis not present

## 2022-12-11 DIAGNOSIS — M546 Pain in thoracic spine: Secondary | ICD-10-CM | POA: Diagnosis not present

## 2022-12-17 ENCOUNTER — Other Ambulatory Visit (HOSPITAL_COMMUNITY): Payer: Self-pay

## 2022-12-28 ENCOUNTER — Other Ambulatory Visit: Payer: Self-pay

## 2022-12-28 ENCOUNTER — Other Ambulatory Visit (HOSPITAL_COMMUNITY): Payer: Self-pay

## 2022-12-28 DIAGNOSIS — M47817 Spondylosis without myelopathy or radiculopathy, lumbosacral region: Secondary | ICD-10-CM | POA: Diagnosis not present

## 2022-12-28 DIAGNOSIS — M546 Pain in thoracic spine: Secondary | ICD-10-CM | POA: Diagnosis not present

## 2022-12-28 DIAGNOSIS — G8929 Other chronic pain: Secondary | ICD-10-CM | POA: Diagnosis not present

## 2022-12-28 DIAGNOSIS — M5186 Other intervertebral disc disorders, lumbar region: Secondary | ICD-10-CM | POA: Diagnosis not present

## 2022-12-28 DIAGNOSIS — M5136 Other intervertebral disc degeneration, lumbar region: Secondary | ICD-10-CM | POA: Diagnosis not present

## 2022-12-28 DIAGNOSIS — M47816 Spondylosis without myelopathy or radiculopathy, lumbar region: Secondary | ICD-10-CM | POA: Diagnosis not present

## 2022-12-28 MED ORDER — TIZANIDINE HCL 4 MG PO TABS
ORAL_TABLET | ORAL | 1 refills | Status: DC
  Filled 2022-12-28: qty 90, 30d supply, fill #0

## 2023-01-22 DIAGNOSIS — M47816 Spondylosis without myelopathy or radiculopathy, lumbar region: Secondary | ICD-10-CM | POA: Diagnosis not present

## 2023-01-22 DIAGNOSIS — M5459 Other low back pain: Secondary | ICD-10-CM | POA: Diagnosis not present

## 2023-01-28 DIAGNOSIS — M545 Low back pain, unspecified: Secondary | ICD-10-CM | POA: Diagnosis not present

## 2023-01-31 DIAGNOSIS — M545 Low back pain, unspecified: Secondary | ICD-10-CM | POA: Diagnosis not present

## 2023-02-03 ENCOUNTER — Other Ambulatory Visit (HOSPITAL_COMMUNITY): Payer: Self-pay

## 2023-02-04 ENCOUNTER — Other Ambulatory Visit (HOSPITAL_COMMUNITY): Payer: Self-pay

## 2023-02-04 DIAGNOSIS — M545 Low back pain, unspecified: Secondary | ICD-10-CM | POA: Diagnosis not present

## 2023-02-08 IMAGING — DX DG FOOT COMPLETE 3+V*R*
3 series · 3 of 3 positions shown · non-contrast
Comparison: None.

CLINICAL DATA: Gout.  Right great toe pain.

EXAM:
RIGHT FOOT COMPLETE - 3+ VIEW

[foot ap wb]
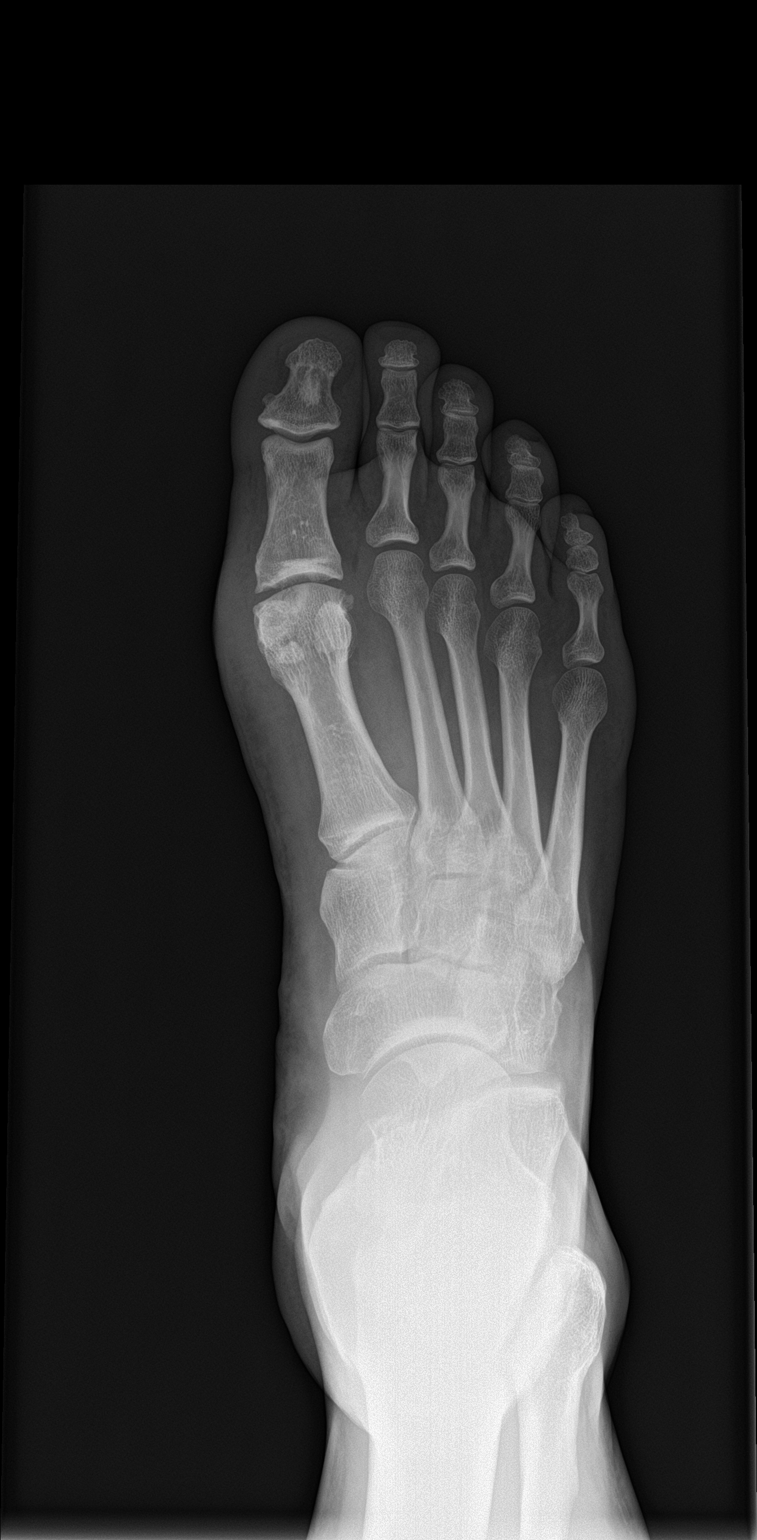

[foot obl wb]
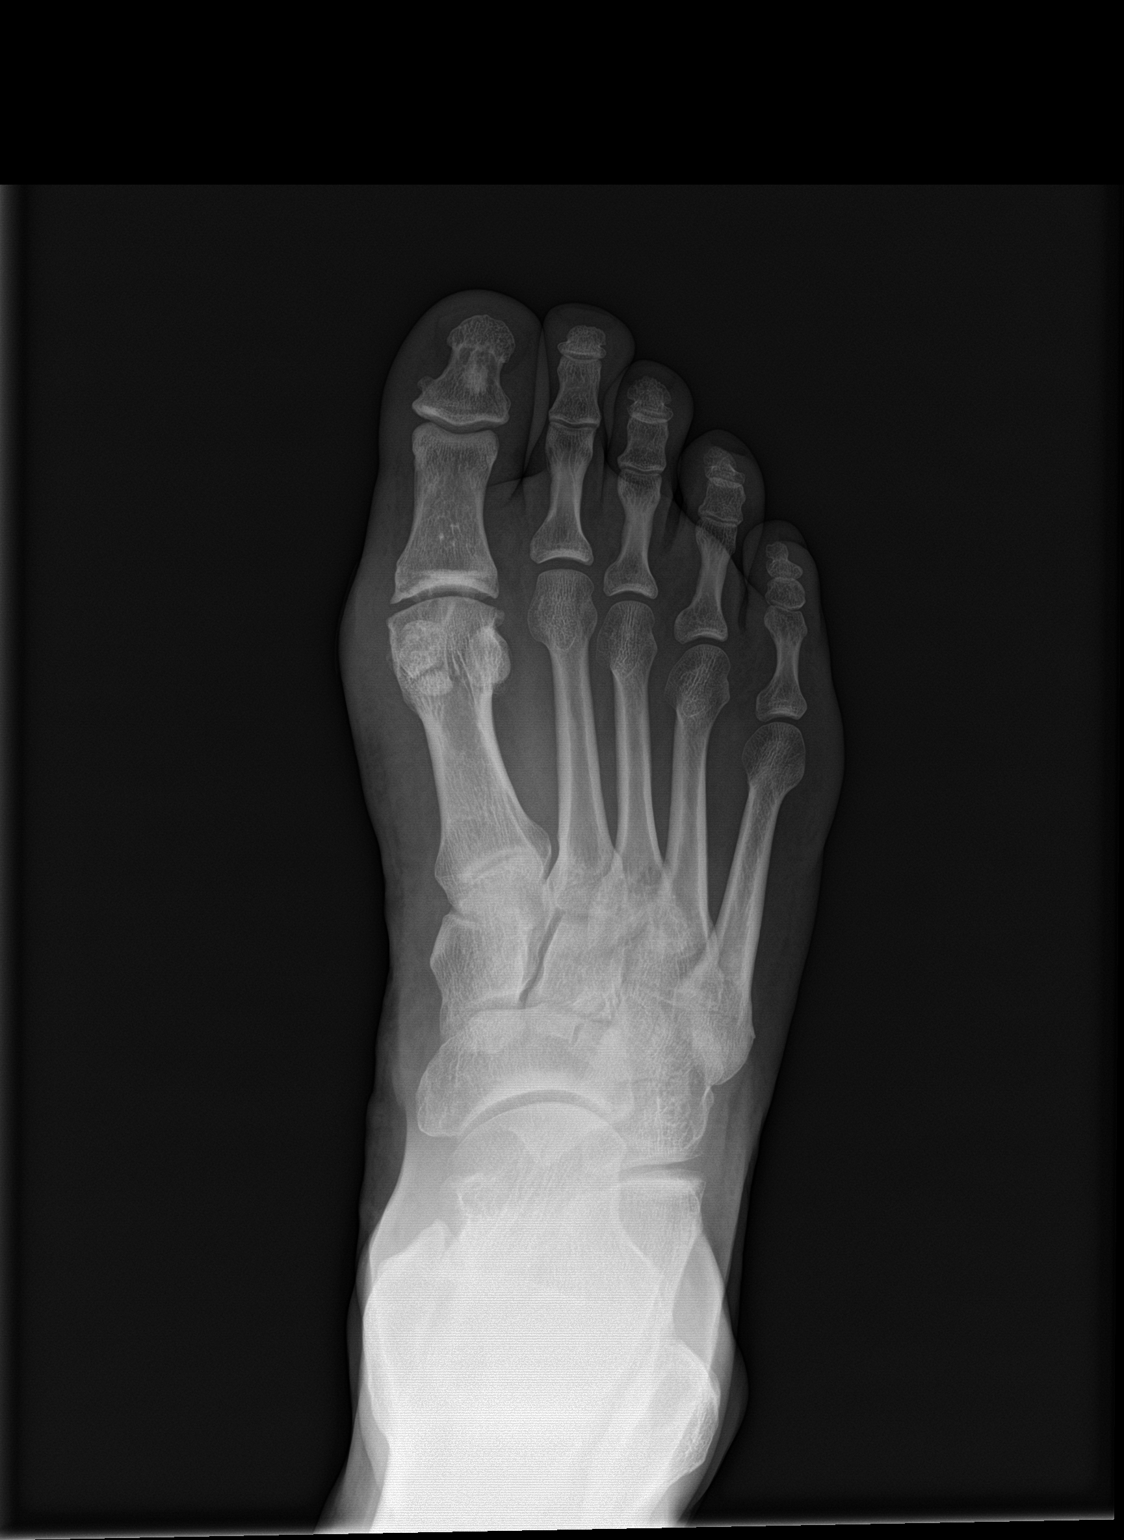

[foot lat wb]
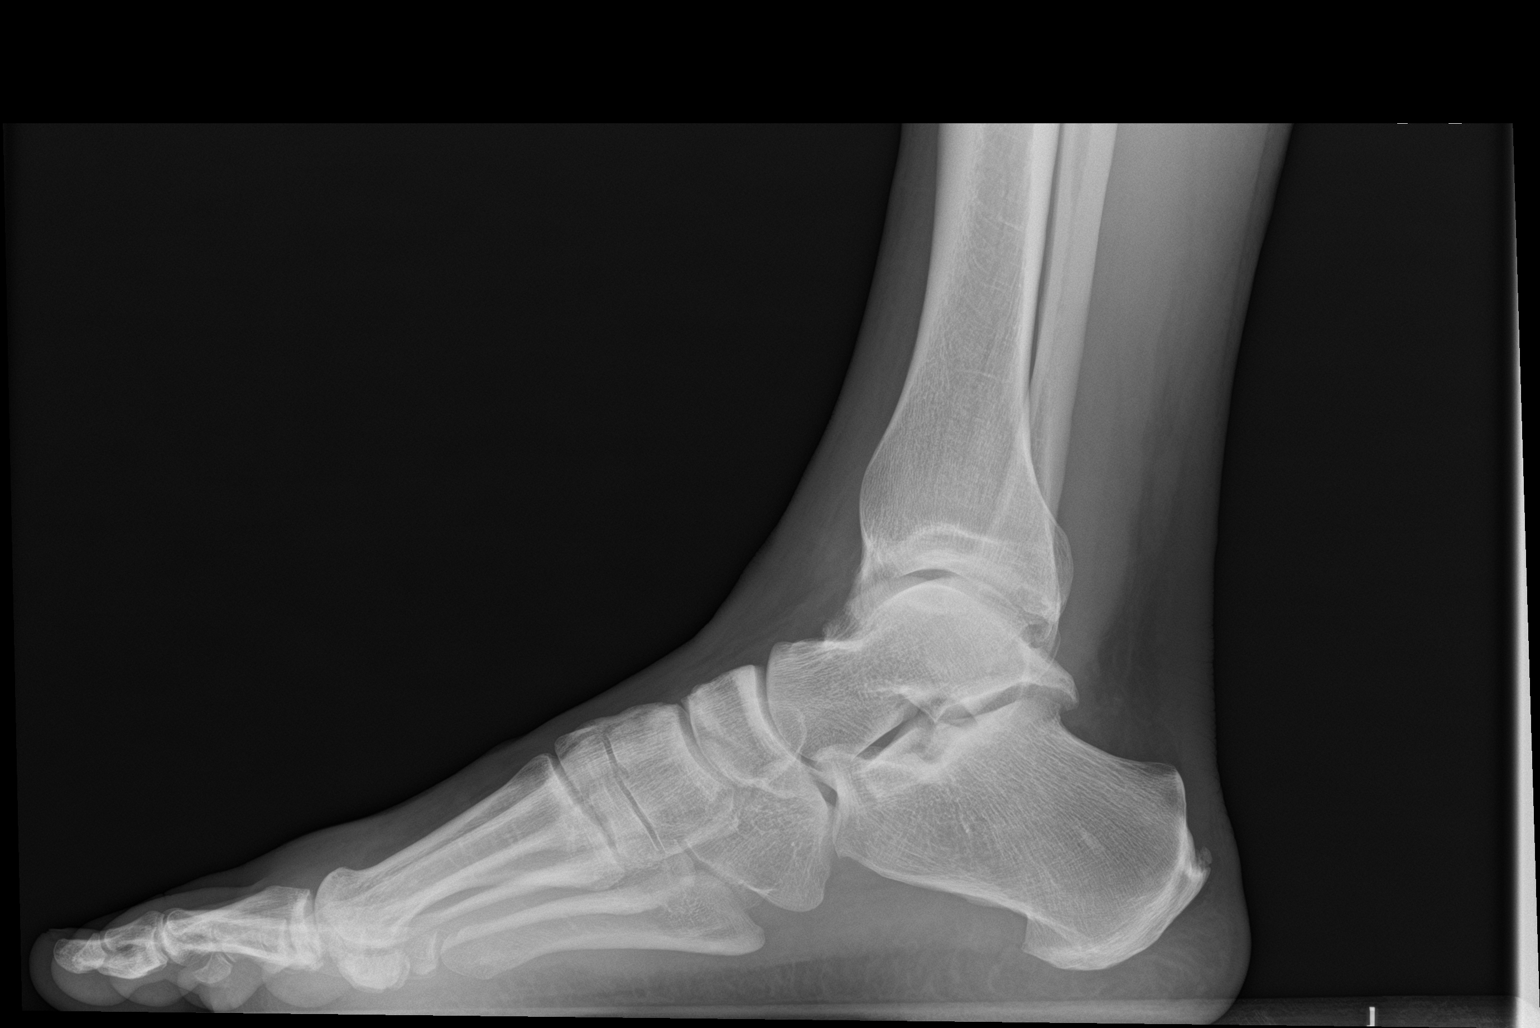

[3 of 3 positions shown; findings below may reference images not displayed]

FINDINGS: None available mild great toe joint space narrowing. There is a
lucency with thin sclerotic border measuring up to 12 mm in
transverse dimension of the distal medial aspect of the great toe
metatarsal head. Minimal 1 mm concave possible erosion at the medial
base of the proximal phalanx of the great toe. Minimal lateral great
toe metatarsal head degenerative spurring. Mild chronic spurring at
the Achilles insertion on the calcaneus.

Mild soft tissue swelling around the great toe metatarsophalangeal
joint.

No acute fracture or dislocation.
IMPRESSION: Mild joint space narrowing of the great toe metatarsophalangeal
joint with likely peripheral degenerative osteophytosis.

A lucency within the distal medial aspect of the great toe
metatarsal head may represent a degenerative subchondral cyst. A
small cortical erosion related to patient's reported gout is also
possible.

## 2023-02-14 DIAGNOSIS — M545 Low back pain, unspecified: Secondary | ICD-10-CM | POA: Diagnosis not present

## 2023-03-12 ENCOUNTER — Other Ambulatory Visit (HOSPITAL_COMMUNITY): Payer: Self-pay

## 2023-03-14 ENCOUNTER — Other Ambulatory Visit (HOSPITAL_COMMUNITY): Payer: Self-pay

## 2023-03-14 MED ORDER — ROSUVASTATIN CALCIUM 10 MG PO TABS
10.0000 mg | ORAL_TABLET | Freq: Every day | ORAL | 0 refills | Status: DC
  Filled 2023-03-14 – 2023-03-25 (×3): qty 90, 90d supply, fill #0

## 2023-03-22 DIAGNOSIS — M5459 Other low back pain: Secondary | ICD-10-CM | POA: Diagnosis not present

## 2023-03-22 DIAGNOSIS — G8929 Other chronic pain: Secondary | ICD-10-CM | POA: Diagnosis not present

## 2023-03-22 DIAGNOSIS — M5136 Other intervertebral disc degeneration, lumbar region: Secondary | ICD-10-CM | POA: Diagnosis not present

## 2023-03-22 DIAGNOSIS — M7918 Myalgia, other site: Secondary | ICD-10-CM | POA: Diagnosis not present

## 2023-03-25 ENCOUNTER — Other Ambulatory Visit (HOSPITAL_COMMUNITY): Payer: Self-pay

## 2023-03-26 ENCOUNTER — Other Ambulatory Visit (HOSPITAL_COMMUNITY): Payer: Self-pay

## 2023-04-30 DIAGNOSIS — M47816 Spondylosis without myelopathy or radiculopathy, lumbar region: Secondary | ICD-10-CM | POA: Diagnosis not present

## 2023-05-14 ENCOUNTER — Other Ambulatory Visit (HOSPITAL_COMMUNITY): Payer: Self-pay

## 2023-05-14 MED ORDER — LOSARTAN POTASSIUM 100 MG PO TABS
100.0000 mg | ORAL_TABLET | Freq: Every day | ORAL | 0 refills | Status: DC
  Filled 2023-05-14: qty 90, 90d supply, fill #0

## 2023-06-18 ENCOUNTER — Other Ambulatory Visit (HOSPITAL_COMMUNITY): Payer: Self-pay

## 2023-06-18 MED ORDER — ROSUVASTATIN CALCIUM 10 MG PO TABS
10.0000 mg | ORAL_TABLET | Freq: Every day | ORAL | 0 refills | Status: DC
  Filled 2023-06-18 (×2): qty 90, 90d supply, fill #0

## 2023-08-15 ENCOUNTER — Other Ambulatory Visit (HOSPITAL_COMMUNITY): Payer: Self-pay

## 2023-08-16 ENCOUNTER — Other Ambulatory Visit (HOSPITAL_COMMUNITY): Payer: Self-pay

## 2023-08-16 MED ORDER — LOSARTAN POTASSIUM 100 MG PO TABS
100.0000 mg | ORAL_TABLET | Freq: Every day | ORAL | 0 refills | Status: DC
  Filled 2023-08-16: qty 90, 90d supply, fill #0

## 2023-09-06 ENCOUNTER — Other Ambulatory Visit (HOSPITAL_COMMUNITY): Payer: Self-pay

## 2023-09-06 MED ORDER — ROSUVASTATIN CALCIUM 10 MG PO TABS
10.0000 mg | ORAL_TABLET | Freq: Every day | ORAL | 0 refills | Status: DC
  Filled 2023-09-06 – 2023-09-23 (×2): qty 90, 90d supply, fill #0

## 2023-09-16 ENCOUNTER — Other Ambulatory Visit (HOSPITAL_COMMUNITY): Payer: Self-pay

## 2023-09-23 ENCOUNTER — Other Ambulatory Visit (HOSPITAL_COMMUNITY): Payer: Self-pay

## 2023-09-24 ENCOUNTER — Other Ambulatory Visit: Payer: Self-pay

## 2023-09-27 DIAGNOSIS — G8929 Other chronic pain: Secondary | ICD-10-CM | POA: Diagnosis not present

## 2023-09-27 DIAGNOSIS — M7918 Myalgia, other site: Secondary | ICD-10-CM | POA: Diagnosis not present

## 2023-09-27 DIAGNOSIS — M5459 Other low back pain: Secondary | ICD-10-CM | POA: Diagnosis not present

## 2023-09-27 DIAGNOSIS — M5136 Other intervertebral disc degeneration, lumbar region with discogenic back pain only: Secondary | ICD-10-CM | POA: Diagnosis not present

## 2023-10-24 DIAGNOSIS — E782 Mixed hyperlipidemia: Secondary | ICD-10-CM | POA: Diagnosis not present

## 2023-10-24 DIAGNOSIS — Z125 Encounter for screening for malignant neoplasm of prostate: Secondary | ICD-10-CM | POA: Diagnosis not present

## 2023-10-24 DIAGNOSIS — I10 Essential (primary) hypertension: Secondary | ICD-10-CM | POA: Diagnosis not present

## 2023-10-24 DIAGNOSIS — Z0000 Encounter for general adult medical examination without abnormal findings: Secondary | ICD-10-CM | POA: Diagnosis not present

## 2023-11-05 DIAGNOSIS — M5459 Other low back pain: Secondary | ICD-10-CM | POA: Diagnosis not present

## 2023-11-05 DIAGNOSIS — G8929 Other chronic pain: Secondary | ICD-10-CM | POA: Diagnosis not present

## 2023-11-05 DIAGNOSIS — M545 Low back pain, unspecified: Secondary | ICD-10-CM | POA: Diagnosis not present

## 2023-11-11 ENCOUNTER — Other Ambulatory Visit (HOSPITAL_COMMUNITY): Payer: Self-pay

## 2023-11-14 ENCOUNTER — Other Ambulatory Visit: Payer: Self-pay

## 2023-11-14 ENCOUNTER — Other Ambulatory Visit (HOSPITAL_COMMUNITY): Payer: Self-pay

## 2023-11-14 MED ORDER — ROSUVASTATIN CALCIUM 10 MG PO TABS
10.0000 mg | ORAL_TABLET | Freq: Every day | ORAL | 0 refills | Status: DC
  Filled 2023-11-14 – 2023-12-22 (×2): qty 90, 90d supply, fill #0

## 2023-11-14 MED ORDER — LOSARTAN POTASSIUM 100 MG PO TABS
100.0000 mg | ORAL_TABLET | Freq: Every day | ORAL | 0 refills | Status: DC
  Filled 2023-11-14: qty 90, 90d supply, fill #0

## 2023-12-23 ENCOUNTER — Other Ambulatory Visit: Payer: Self-pay

## 2023-12-23 ENCOUNTER — Other Ambulatory Visit (HOSPITAL_COMMUNITY): Payer: Self-pay

## 2024-02-09 ENCOUNTER — Other Ambulatory Visit (HOSPITAL_COMMUNITY): Payer: Self-pay

## 2024-02-10 ENCOUNTER — Other Ambulatory Visit (HOSPITAL_COMMUNITY): Payer: Self-pay

## 2024-02-10 MED ORDER — LOSARTAN POTASSIUM 100 MG PO TABS
100.0000 mg | ORAL_TABLET | Freq: Every day | ORAL | 1 refills | Status: DC
  Filled 2024-02-10: qty 90, 90d supply, fill #0
  Filled 2024-03-23 – 2024-05-12 (×2): qty 90, 90d supply, fill #1

## 2024-02-11 ENCOUNTER — Other Ambulatory Visit: Payer: Self-pay

## 2024-03-23 ENCOUNTER — Other Ambulatory Visit (HOSPITAL_COMMUNITY): Payer: Self-pay

## 2024-03-23 MED ORDER — ROSUVASTATIN CALCIUM 10 MG PO TABS
10.0000 mg | ORAL_TABLET | Freq: Every day | ORAL | 0 refills | Status: DC
  Filled 2024-03-23: qty 90, 90d supply, fill #0

## 2024-03-24 ENCOUNTER — Other Ambulatory Visit: Payer: Self-pay

## 2024-04-02 DIAGNOSIS — K635 Polyp of colon: Secondary | ICD-10-CM | POA: Diagnosis not present

## 2024-04-02 DIAGNOSIS — Z1211 Encounter for screening for malignant neoplasm of colon: Secondary | ICD-10-CM | POA: Diagnosis not present

## 2024-04-02 DIAGNOSIS — Z09 Encounter for follow-up examination after completed treatment for conditions other than malignant neoplasm: Secondary | ICD-10-CM | POA: Diagnosis not present

## 2024-04-02 DIAGNOSIS — Z800 Family history of malignant neoplasm of digestive organs: Secondary | ICD-10-CM | POA: Diagnosis not present

## 2024-04-02 DIAGNOSIS — Z860101 Personal history of adenomatous and serrated colon polyps: Secondary | ICD-10-CM | POA: Diagnosis not present

## 2024-04-02 DIAGNOSIS — D123 Benign neoplasm of transverse colon: Secondary | ICD-10-CM | POA: Diagnosis not present

## 2024-04-18 ENCOUNTER — Other Ambulatory Visit (HOSPITAL_COMMUNITY): Payer: Self-pay

## 2024-04-18 DIAGNOSIS — E782 Mixed hyperlipidemia: Secondary | ICD-10-CM | POA: Diagnosis not present

## 2024-04-18 DIAGNOSIS — I10 Essential (primary) hypertension: Secondary | ICD-10-CM | POA: Diagnosis not present

## 2024-04-18 DIAGNOSIS — H8111 Benign paroxysmal vertigo, right ear: Secondary | ICD-10-CM | POA: Diagnosis not present

## 2024-04-18 DIAGNOSIS — Z860100 Personal history of colon polyps, unspecified: Secondary | ICD-10-CM | POA: Diagnosis not present

## 2024-04-18 DIAGNOSIS — R072 Precordial pain: Secondary | ICD-10-CM | POA: Diagnosis not present

## 2024-04-18 DIAGNOSIS — R519 Headache, unspecified: Secondary | ICD-10-CM | POA: Diagnosis not present

## 2024-04-18 DIAGNOSIS — Z79899 Other long term (current) drug therapy: Secondary | ICD-10-CM | POA: Diagnosis not present

## 2024-04-18 DIAGNOSIS — I493 Ventricular premature depolarization: Secondary | ICD-10-CM | POA: Diagnosis not present

## 2024-04-18 DIAGNOSIS — R42 Dizziness and giddiness: Secondary | ICD-10-CM | POA: Diagnosis not present

## 2024-04-18 MED ORDER — METOPROLOL SUCCINATE ER 25 MG PO TB24
25.0000 mg | ORAL_TABLET | Freq: Every day | ORAL | 5 refills | Status: AC
  Filled 2024-04-20: qty 30, 30d supply, fill #0

## 2024-04-18 NOTE — ED Provider Notes (Signed)
 [facility]  ED Provider Note  Cody Graham 48 y.o. male DOB: 01/29/1976 MRN: [id number] History   Chief Complaint  Patient presents with   Near Syncope    BIB EMS from Home, EMS state pt. Went to bathroom and felt dizziness, near syncope. (-) CP/SOB. 1L PNSS and Zofran 4mg  IV given. EMS vitals BG 149, HR  80s, BP 180/110 then 160/110.   Pt has hx HTN, chronic low back pain. Pt is an ED nurse. Pt said he's seen Dr Gretta in the past for dizzy spells and has known PVCs, was diagnosed with long covid. Pt felt well yesterday, normal activity. Pt woke up at 445am to go to the bathroom and felt dizzy when he rolled over. He tried to stand and became extremely dizzy, diaphoretic. No chest pain/dyspnea. Pt said his apple watch showed PVCs and at one point trigeminy. No numbness/weakness. Pt is having blurry vision with the dizzy episode, no loss of vision/speech issues. When he lays down, he feels normal. No recent head trauma. Pt said he's had negative stress echo, monitor in the past.   History provided by:  Patient, EMS personnel, medical records and spouse Near Syncope Associated symptoms: diaphoresis and dizziness   Associated symptoms: no chest pain, no shortness of breath and no weakness        Past Medical History:  Diagnosis Date   Bulging lumbar disc    Colon polyp    Hemorrhoids    Hypertension     Past Surgical History:  Procedure Laterality Date   Appendectomy     Colonoscopy  08/07/2010   Colonoscopy  09/30/2013   Colonoscopy  01/29/2019   Colonoscopy  04/02/2024    Social History   Substance and Sexual Activity  Alcohol Use No   Comment: per week   Tobacco Use History[1] E-Cigarettes   Vaping Use Never User    Start Date     Cartridges/Day     Quit Date     Social History   Substance and Sexual Activity  Drug Use No         Allergies[2]  Home Medications   LOSARTAN  POTASSIUM (COZAAR ) 100 MG TABLET    Take 1  tablet (100 mg total) by mouth daily.   NA SULFATE-POTASSIUM SULFATE-MAGNESIUM SULFATE (SUPREP BOWEL PREP) 17.5-3.13-1.6 GM/177ML    Take 177 mLs by mouth 2 (two) times. Please refer to prep instructions sent by [facility]   ROSUVASTATIN  CALCIUM  (CRESTOR ) 10 MG TABLET    Take 1 tablet (10 mg total) by mouth at bedtime.    Primary Survey  Primary Survey  Review of Systems   Review of Systems  Constitutional:  Positive for diaphoresis and fatigue.  Respiratory:  Negative for cough and shortness of breath.   Cardiovascular:  Negative for chest pain.  Gastrointestinal:  Negative for abdominal pain.  Neurological:  Positive for dizziness and light-headedness. Negative for syncope, weakness and numbness.    Physical Exam   ED Triage Vitals [04/18/24 0638]  BP (!) 153/96  Heart Rate 67  Resp 18  SpO2 100 %  Temp 98 F (36.7 C)    Physical Exam  Nursing note and vitals reviewed. Constitutional: He appears well-developed and well-nourished.  HENT:  Head: Normocephalic and atraumatic.  Eyes: EOM are intact. Pupils are equal, round, and reactive to light.  Cardiovascular: Normal rate and regular rhythm. Frequent extrasystoles are present.  Pulmonary/Chest: Respiratory effort normal and breath sounds normal.  Musculoskeletal: no edema.  Neurological: He is alert and oriented to person, place, and time. Moves all extremities equally. He has normal speech. Cranial nerves intact II through XII. Finger to nose intact bilaterally. Sensation intact to light touch, bilateral upper and lower extremities. Strength 5/5 bilateral upper and lower extremities.  Skin: Skin is warm. Skin is dry.  Psychiatric: He has a normal mood and affect. His behavior is normal.     ED Course   Lab results:   COMPREHENSIVE METABOLIC PANEL - Abnormal      Result Value   Na 143     Potassium 3.9     Cl 108     CO2 24     AGAP 11     Glucose 96     BUN 17     Creatinine 1.10     Ca 8.6 (*)    ALK PHOS 67      T Bili 1.0     Total Protein 5.9 (*)    Alb 4.1     GLOBULIN 1.8     ALBUMIN/GLOBULIN RATIO 2.3     BUN/CREAT RATIO 15.5     ALT 32     AST 25     eGFR 82     Comment: Normal GFR (glomerular filtration rate) > 60 mL/min/1.73 meters squared, < 60 may include impaired kidney function. Calculation based on the Chronic Kidney Disease Epidemiology Collaboration (CK-EPI)equation refit without adjustment for race.  MAGNESIUM - Normal   Mg 1.9    PROTIME-INR - Normal   PT 13.8     Comment: Many commonly administered drugs may affect the results obtained in PT and PTT testing.   INR 1.0     Comment: INR Therapeutic Range for DVT, PE:      2.0 - 3.0 INR Mechanical Prosthetic Heart Valves: 2.5 - 3.5  PTT - Normal   PTT 30     Comment: Many commonly administered drugs may affect the results obtained in PT and PTT testing.  GEN5 CARDIAC TROPONIN T (TNT5) BASELINE - Normal   TnT-Gen5 (0hr) 7     Comment: An elevated Troponin indicates myocardial damage. Elevated troponin may also be due to pulmonary emboli, aortic dissection, heart failure, trauma, toxins and ischemia in the setting of critical illness.   GEN5 CARDIAC TROPONIN T(TNT5) 1 HOUR - Normal   TnT-Gen5 (1hr) 6     Comment: An elevated Troponin indicates myocardial damage. Elevated troponin may also be due to pulmonary emboli, aortic dissection, heart failure, trauma, toxins and ischemia in the setting of critical illness.   Delta 1 Hour -1    CBC AND DIFFERENTIAL   WBC 6.0     RBC 4.83     HGB 15.3     HCT 44.4     MCV 91.9     MCH 31.7     MCHC 34.5     Plt Ct 221     RDW SD 39.4     MPV 10.2     NRBC% 0.0     Absolute NRBC Count 0.00     NEUTROPHIL % 48.8     LYMPHOCYTE % 40.0     MONOCYTE % 7.5     Eosinophil % 2.7     BASOPHIL % 0.5     IG% 0.5     ABSOLUTE NEUTROPHIL COUNT 2.91     ABSOLUTE LYMPHOCYTE COUNT 2.39     Absolute Monocyte Count 0.45     Absolute Eosinophil Count 0.16  Absolute Basophil Count 0.03      Absolute Immature Granulocyte Count 0.03    GEN5 CARDIAC TROPONIN T(TNT5) 3 HOUR  POCT ISTAT VENOUS PANEL   POC CREAT 1.3     SAMPLE SOURCE VENOUS     INSTRUMENT ID [id number]     OPERATOR ID [id number]    POCT ISTAT CREATININE    Imaging:   XR CHEST AP PORTABLE   Narrative:    AP chest radiograph: 04/18/2024 7:19 AM  Indication: 48 years  old Male with dizziness.  Comparison: 08/11/2020  Findings: The cardiomediastinal silhouette is at the upper limits of normal in size.   No pneumothorax is seen.   No acute airspace opacities are seen.  No discrete pleural effusion is apparent.    Impression:    Impression: No acute airspace opacities are seen.  Electronically Signed by: Rock Croft, MD on 04/18/2024 7:19 AM  CT HEAD WO CONTRAST   Narrative:    NONCONTRAST BRAIN CT  INDICATION: Vertigo 780.40.   COMPARISON: None  TECHNIQUE: Standard noncontrast brain CT.  FINDINGS:  No hemorrhage or extra-axial fluid collection.  No cerebral edema or acute cortical infarction.  No mass, mass effect, or midline shift.  No hydrocephalus.  Visualized paranasal sinuses are clear. Mastoid air cells are clear. Visualized orbits are unremarkable. Calvarium is intact.    Impression:    IMPRESSION:  No acute intracranial abnormality.   Electronically Signed by: Rilla Pickett on 04/18/2024 8:09 AM  CT ANGIO HEAD NECK   Narrative:    CT ANGIO HEAD NECK  INDICATION: headache  COMPARISON: None Available at Time of Dictation  TECHNIQUE: Thin axial scans were performed through the head and neck after bolus injection of intravenous contrast. 3-D MIP images were generated. The degree of cervical ICA stenosis (if any) was determined using NASCET-like measurement criteria: Severe: 70-99%; Moderate: 50-69%; Mild: Less than 50%.  CONTRAST: 60 mL cc of IOPAMIDOL 76 % IV SOLN.   FINDINGS: CTA head: Intracranial vessels: #  Distal internal carotid arteries: No occlusion or  significant stenosis. #  Anterior cerebral arteries: No occlusion or significant stenosis. #  Middle cerebral arteries: No occlusion or significant stenosis. #  Distal vertebral arteries: No occlusion or significant stenosis. #  Basilar artery: No occlusion or significant stenosis. #  Posterior cerebral arteries: No occlusion or significant stenosis. #  Dural sinuses: Patent but not well evaluated secondary to arterial phase of contrast.  CTA neck: #  Aortic arch and brachiocephalic vessels: No significant abnormality. #  Right carotid artery: No occlusion or significant stenosis. #  Left carotid artery: No occlusion or significant stenosis. #  Right vertebral: No occlusion or significant stenosis. Right dominant vertebral artery. #  Left vertebral: No occlusion or significant stenosis. The left vertebral artery terminates as the left posterior inferior cerebellar artery.  #  Additional findings: No significant finding.    Impression:    IMPRESSION: 1.  No occlusion or significant stenosis.  Electronically Signed by: Valma Companion, MD on 04/18/2024 8:35 AM  MRI HEAD WO CONTRAST     ECG: ECG Results          ECG 12 lead (Final result)  Result time 04/18/24 08:19:06    Final result             Narrative:   Diagnosis Class Abnormal Acquisition Device D3K Systolic BP 165 Diastolic BP 97 Ventricular Rate 78 Atrial Rate 78 P-R Interval 134 QRS Duration 92 Q-T Interval 366 QTC Calculation(Bazett) 417 Calculated  P Axis 38 Calculated R Axis 38 Calculated T Axis 16  Diagnosis Sinus rhythm with frequent premature ventricular complexes Low voltage QRS Septal infarct (cited on or before 18-Apr-2024) Abnormal ECG When compared with ECG of 18-Apr-2024 06:36, No significant change was found Whitney Factor ([id number]) on 04/18/2024 8:18:57 AM certifies that he/she has reviewed the ECG tracing and confirms the  independent interpretation is correct.                          ECG 12 lead (Final result)  Result time 04/18/24 08:18:36    Final result             Narrative:   Diagnosis Class Abnormal Acquisition Device D3K Systolic BP 153 Diastolic BP 96 Ventricular Rate 74 Atrial Rate 74 P-R Interval 132 QRS Duration 92 Q-T Interval 368 QTC Calculation(Bazett) 408 Calculated P Axis 37 Calculated R Axis 41 Calculated T Axis 18  Diagnosis Sinus rhythm with frequent premature ventricular complexes Low voltage QRS Septal infarct , age undetermined Abnormal ECG No previous ECGs available no stemi mas 0637 Whitney Factor ([id number]) on 04/18/2024 8:18:28 AM certifies that he/she has reviewed the ECG tracing and confirms the  independent interpretation is correct.                            HEAR Score History: Mostly low risk features ECG: Non specific repolarisation disturbance Age: 57-64 yrs Risk Factors: More than or equal 3 risk factors or history of atherosclerotic disease HEAR Score Total: 4                                                           Pre-Sedation Procedures    Medical Decision Making Pt presents for acute onset dizziness with imbalance/near syncope. Worse in ED with laying his head back/EOM testing. Sx's resolve when he is still. He does have frequent PVCs, has hx PVCs. I'll check labs/imaging, give IVF, medication and reassess.  Patient symptoms did improve with initial round of medication but he did still have some dizziness with head movement, position changes.  MRI added as well as additional medication.  Magnesium was low normal so oral replacement ordered.  The patient's wife has been in contact with cardiology who will see the patient.  I asked NICs to admit.  Discussed treatment plan and MRI with patient and wife.  Medications magnesium oxide (MAG-OX) tablet 800 mg (has no administration in time range) meclizine HCl (ANTIVERT) tablet 25 mg (has no administration in  time range) diazepam (VALIUM) injection 2.5 mg (has no administration in time range) NaCl 0.9% bolus 1,000 mL (0 mLs IntraVENous Stopped 04/18/24 0913) meclizine HCl (ANTIVERT) tablet 25 mg (25 mg Oral Given 04/18/24 0717) diazepam (VALIUM) injection 5 mg (5 mg IntraVENous Given 04/18/24 0721) iopamidol (ISOVUE-370) 76% injection 60 mL (60 mLs IntraVENous Given 04/18/24 0737)  The differential diagnosis associated with the patient's presentation includes: vertigo, CVA, arrhythmia, dehydration, electrolyte abnormality among other diagnoses  I considered admitting/observing and elected to, patient quires close cardiac monitoring, additional testing and treatment      Chronic condition affecting patient care: Past Medical History: No date: Bulging lumbar disc No date: Colon polyp No date: Hemorrhoids No date: Hypertension  Amount and/or Complexity of Data Reviewed Independent Historian: spouse and EMS    Details: EMS provided report, wife came to ED/bedside, helpful with history External Data Reviewed: notes.    Details: Reviewed previous cardiology note, most recently 2023 Labs: ordered. Decision-making details documented in ED Course. Radiology: ordered and independent interpretation performed. Decision-making details documented in ED Course. ECG/medicine tests: ordered and independent interpretation performed. Decision-making details documented in ED Course.  Risk Prescription drug management. Decision regarding hospitalization.          Provider Communication  New Prescriptions   No medications on file    Modified Medications   No medications on file    Discontinued Medications   No medications on file    Clinical Impression Final diagnoses:  Dizziness  Frequent PVCs    ED Disposition     ED Disposition  Admit   Condition  --   Comment  --                   Electronically signed by:       [1] Social History Tobacco Use   Smoking Status Never   Passive exposure: Past  Smokeless Tobacco Never  [2] Allergies Allergen Reactions   Augmentin Rash   Lisinopril     cough   [place] DELENA Chapel, DO 04/18/24 [phone]

## 2024-04-20 ENCOUNTER — Other Ambulatory Visit (HOSPITAL_COMMUNITY): Payer: Self-pay

## 2024-05-13 ENCOUNTER — Other Ambulatory Visit (HOSPITAL_COMMUNITY): Payer: Self-pay

## 2024-05-18 DIAGNOSIS — M545 Low back pain, unspecified: Secondary | ICD-10-CM | POA: Diagnosis not present

## 2024-05-18 DIAGNOSIS — M542 Cervicalgia: Secondary | ICD-10-CM | POA: Diagnosis not present

## 2024-06-03 NOTE — Progress Notes (Signed)
°  Subjective   Patient ID:  Cody Graham is a 48 y.o. (DOB 01-Jan-1976) male    Patient presents with   Follow-up     HPI:  History of Present Illness  Cody Graham is a pleasant 48 year old male who presents today for chronic condition follow-up. He has hyperlipidemia and hypertension at baseline.   He reports experiencing an episode of vertigo a few weeks prior, which he describes as a recurrent issue occurring every couple of years. He has not yet consulted with an ENT specialist regarding this symptom but has an appointment scheduled for the following month. He was told that he had vertigo. However, unlike the current episode, he was able to maintain functionality during that time. He continues to experience intermittent dizziness, which he manages by increasing his water intake, noting that dehydration exacerbates his symptoms. He is not currently needing to take meclizine.  During his last episode of vertigo, he experienced a significant increase in PVCs, which were previously evaluated by cardiology and found to be non-contributory. The cardiologist suggested that the vertigo may be exacerbating the PVCs. He reports no associated chest pain but experienced dizziness to the point of being unable to rise from bed. His Apple watch recorded bigeminy and trigeminy during these episodes. His electrolyte levels were within normal limits, and a CT angiogram of the head and neck was negative. He declined an MRI at that time. He is not currently taking metoprolol  as prescribed as it causes profound fatigue.    Reviewed and updated this visit by provider: Tobacco  Allergies  Meds  Problems  Med Hx  Surg Hx  Fam Hx        Review of Systems is complete and negative except as noted in HPI     Objective   Vitals:   06/03/24 0827  BP: 124/86  Patient Position: Sitting  Pulse: 67  Temp: 97.1 F (36.2 C)  TempSrc: Temporal  Weight: 255 lb 8 oz (115.9 kg)  SpO2: 96%  PainSc: 0-No  pain      Physical Exam  General:  Well developed, Well nourished, No distress HEENT: Normocephalic, atraumatic;  CV:  S1S2, Regular rate and rhythm, without murmur, gallops or rubs Lungs:  Clear to auscultation bilaterally with normal effort Skin:  No focal rashes  Neuro:  No focal deficits;  Results       Assessment and Plan  1. Essential hypertension (Primary) -     Comprehensive Metabolic Panel; Future -     Lipid panel; Future 2. Mixed hyperlipidemia -     Comprehensive Metabolic Panel; Future -     Lipid panel; Future    Assessment & Plan Hypertension- Well controlled. Continue current regimen.   Hyperlipidemia- Update Lipids today. Continue current regimen. Denies chest pain.     Follow-up: The patient will follow up in 1 year.  Follow up in about 1 year (around 06/03/2025) for chronic care management.       Risks, benefits, and alternatives of the medications and treatment plan prescribed today were discussed, and patient expressed understanding. Plan follow-up as discussed or as needed if any worsening symptoms or change in condition.    A yearly preventative health exam was recommended and current age based recommendations were discussed.  AI technology was used in creating this visit note. Verbal consent from the patient/caregiver was obtained prior to its use.

## 2024-06-16 ENCOUNTER — Other Ambulatory Visit (HOSPITAL_COMMUNITY): Payer: Self-pay

## 2024-06-17 ENCOUNTER — Other Ambulatory Visit (HOSPITAL_COMMUNITY): Payer: Self-pay

## 2024-06-17 MED ORDER — ROSUVASTATIN CALCIUM 10 MG PO TABS
10.0000 mg | ORAL_TABLET | Freq: Every day | ORAL | 0 refills | Status: DC
  Filled 2024-06-17: qty 90, 90d supply, fill #0

## 2024-06-22 ENCOUNTER — Institutional Professional Consult (permissible substitution) (INDEPENDENT_AMBULATORY_CARE_PROVIDER_SITE_OTHER): Admitting: Physician Assistant

## 2024-06-29 ENCOUNTER — Encounter (INDEPENDENT_AMBULATORY_CARE_PROVIDER_SITE_OTHER): Payer: Self-pay

## 2024-08-08 ENCOUNTER — Other Ambulatory Visit (HOSPITAL_COMMUNITY): Payer: Self-pay

## 2024-08-10 ENCOUNTER — Other Ambulatory Visit (HOSPITAL_COMMUNITY): Payer: Self-pay

## 2024-08-10 ENCOUNTER — Other Ambulatory Visit: Payer: Self-pay

## 2024-08-10 MED ORDER — LOSARTAN POTASSIUM 100 MG PO TABS
100.0000 mg | ORAL_TABLET | Freq: Every day | ORAL | 1 refills | Status: AC
  Filled 2024-08-10: qty 90, 90d supply, fill #0

## 2024-08-10 MED ORDER — ROSUVASTATIN CALCIUM 10 MG PO TABS
10.0000 mg | ORAL_TABLET | Freq: Every day | ORAL | 0 refills | Status: AC
  Filled 2024-08-10: qty 90, 90d supply, fill #0
